# Patient Record
Sex: Female | Born: 1955 | Race: White | Hispanic: No | Marital: Married | Smoking: Current every day smoker
Health system: Southern US, Community
[De-identification: ages and names within clinical notes are randomized; demographics above are authoritative.]

## PROBLEM LIST (undated history)

## (undated) DIAGNOSIS — I10 Essential (primary) hypertension: Secondary | ICD-10-CM

## (undated) DIAGNOSIS — F419 Anxiety disorder, unspecified: Secondary | ICD-10-CM

## (undated) DIAGNOSIS — E119 Type 2 diabetes mellitus without complications: Secondary | ICD-10-CM

## (undated) DIAGNOSIS — F1010 Alcohol abuse, uncomplicated: Secondary | ICD-10-CM

## (undated) HISTORY — PX: CHOLECYSTECTOMY: SHX55

---

## 2006-02-15 ENCOUNTER — Emergency Department (HOSPITAL_COMMUNITY): Admission: EM | Admit: 2006-02-15 | Discharge: 2006-02-15 | Payer: Self-pay | Admitting: Emergency Medicine

## 2006-02-15 ENCOUNTER — Inpatient Hospital Stay (HOSPITAL_COMMUNITY): Admission: EM | Admit: 2006-02-15 | Discharge: 2006-02-19 | Payer: Self-pay | Admitting: *Deleted

## 2006-02-16 ENCOUNTER — Ambulatory Visit: Payer: Self-pay | Admitting: *Deleted

## 2006-12-23 ENCOUNTER — Emergency Department (HOSPITAL_COMMUNITY): Admission: EM | Admit: 2006-12-23 | Discharge: 2006-12-23 | Payer: Self-pay | Admitting: Emergency Medicine

## 2009-05-19 ENCOUNTER — Emergency Department (HOSPITAL_COMMUNITY): Admission: EM | Admit: 2009-05-19 | Discharge: 2009-05-19 | Payer: Self-pay | Admitting: Emergency Medicine

## 2010-06-02 ENCOUNTER — Emergency Department (HOSPITAL_COMMUNITY): Admission: EM | Admit: 2010-06-02 | Discharge: 2010-06-02 | Payer: Self-pay | Admitting: Emergency Medicine

## 2010-09-21 ENCOUNTER — Emergency Department (HOSPITAL_COMMUNITY): Admission: EM | Admit: 2010-09-21 | Discharge: 2010-09-21 | Payer: Self-pay | Admitting: Emergency Medicine

## 2011-03-21 NOTE — Discharge Summary (Signed)
NAME:  Robin Foster, Robin Foster NO.:  [id number]   MEDICAL RECORD NO.:  [id number]          PATIENT TYPE:  IPS   LOCATION:  0506                          FACILITY:  [facility]   PHYSICIAN:  Jasmine Pang, M.D. DATE OF BIRTH:  Feb 25, 1956   DATE OF ADMISSION:  02/15/2006  DATE OF DISCHARGE:  02/19/2006                                 DISCHARGE SUMMARY   IDENTIFICATION:  A 55 year old Caucasian married female who was admitted on  a voluntary basis.   HISTORY OF PRESENT ILLNESS:  The patient requested admission for help with  her depression.  She states she had had suicidal ideation for the 2 weeks  prior to the admission with thoughts of killing herself with carbon monoxide  as her brother did.  She is depressed due to cocaine use and bingeing for  the past 2 weeks with alcohol.  She reports severe cocaine cravings and her  drug use is causing marital friction.  Another stressor revolves around the  death of her mother in 11/23/2005.  She is followed by pain management by  Dr. Gerilyn Pilgrim who is tapering her down on medications due to her buying  methadone off the street about 2 weeks ago.  She complains of decreased  sleep and racing thoughts.   PAST PSYCHIATRIC HISTORY:  This is her first [facility]  admission.  She reports she has been admitted yearly for depression for  about the past 7 years to psych units.  She has a history of substance  abuse, cocaine and alcohol and methadone.  Dr. Gerilyn Pilgrim has been treating  her depression but he apparently will not treat her any longer after she was  buying methadone off the street.   SUBSTANCE ABUSE HISTORY:  The patient smokes, opiates off the street,  cocaine, alcohol.   PAST MEDICAL HISTORY:  Medical problems:  Chronic back pain, hypertension.   MEDICATIONS:  Lexapro 20 mg daily, Klonopin 1 mg t.i.d., Duragesic patch 50  mcg q.72 hours, Lortab 1-2 p.o. t.i.d.   PHYSICAL EXAMINATION:  This was done in the  emergency department prior to  admission here.  It was reviewed by nurse practitioner Lynann Bologna, NP.   ADMISSION LABORATORIES:  CBC was grossly within normal limits.  Hepatic  function panel was within normal limits.  TSH was within normal limits.  Other labs were done in the emergency department prior to admission.  Her  urine drug screen was positive for opiates and benzodiazepines.  Alcohol  level was less than 5.   HOSPITAL COURSE:  Upon admission, the patient was placed on the low-dose  Librium protocol, trazodone 50 mg p.o. q.h.s. p.r.n. insomnia, Diovan 75 mg  p.o. b.i.d., ibuprofen 800 mg p.o. t.i.d. p.r.n. pain, Librium 25 mg p.o.  loading dose on arrival.  On February 16, 2006 she was begun on the Clonidine  protocol to detox from opiates since this appeared to be what Dr. Gerilyn Pilgrim,  her pain physician, was attempting to do outpatient.  She was placed on  Seroquel 25 mg p.o. q.6h p.r.n. agitation and Symmetrel 100 mg p.o. b.i.d.  and Lexapro 20 mg daily.  On February 16, 2006 due to pain she was placed on  Robaxin 750 mg p.o. t.i.d.  On February 18, 2006 she was put back on her pain  medications as we found a prescription for her from a pharmacy by another  doctor.  She was placed on Lortab 500 mg 1 p.o. b.i.d. p.r.n. pain and  Duragesic patch 50 mcg apply 1 patch q.72 hours.   The patient was initially in a lot of pain because of withdrawing from her  opiates.  She spent time in bed and had a hard time integrating herself into  groups due to this pain.  She felt better after we restarted her pain  medications (this was done after calling the pharmacy to find out she had a  valid pain medication prescription).  She began to feel better and enjoyed  visits from her daughter and her husband who she described as both being  very supportive.  She did state however they were upset about her continuing  drug use.  She participated more in groups as she began to feel better and  was able  to go to meals and do other things that she had not been doing  before.  Upon discharge the patient stated she was excited about going home.  She was going to have a family session with her husband who she described as  supportive.  She was still having some insomnia with middle of the night  awakening even with trazodone.  Her appetite was better.  She states she has  been grieving the death of her mother and tried to cover up with street  drugs.  She wants to stay away from this negative peer group and rejoin  church for more positive surroundings.   Mental status had improved at discharge.  The patient was less depressed and  anxious.  There was more psychomotor activity and eye contact was good.  Speech normal rate and flow.  Mood less depressed and anxious, affect wide  range, no suicidal or homicidal ideation, no auditory or visual  hallucinations, no delusions or paranoia, thoughts logical and goal  directed.  Thought content within normal limits.  Cognitive exam grossly  intact.   DISCHARGE DIAGNOSES:  AXIS I:  Major depression, recurrent, severe, without  psychosis, polysubstance dependence (cocaine, benzodiazepines, opiates).  AXIS II:  No diagnosis.  AXIS III:  Chronic back pain, hypertension.  AXIS IV:  Moderate, marital stress due to her drug use, grieving the death  of her mother.  AXIS V:  Global assessment of functioning on admission was 30, global  assessment of functioning highest past year was 62, global assessment of  functioning upon discharge was 45.   DISCHARGE MEDICATIONS:  1.  Diovan 80 mg daily.  2.  Amantadine 100 mg p.o. b.i.d.  3.  Lexapro 20 mg p.o. daily.  4.  Trazodone 50 mg 1-2 p.o. q.h.s.  5.  Seroquel 25 mg 1 p.o. q.6h p.r.n. anxiety.   ACTIVITY LEVEL:  No restrictions.   DIET:  No restrictions.   POST HOSPITAL CARE PLAN:  The patient will have followup arranged for her with a chemical dependence counselor in [facility] and she will  also  be assigned to a psychiatrist to monitor her medications (up until now Dr.  Gerilyn Pilgrim has done this).      Jasmine Pang, M.D.  Electronically Signed     BHS/MEDQ  D:  02/19/2006  T:  02/19/2006  Job:  [id number]
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
## 2012-05-23 DIAGNOSIS — E119 Type 2 diabetes mellitus without complications: Secondary | ICD-10-CM | POA: Insufficient documentation

## 2012-05-23 DIAGNOSIS — I10 Essential (primary) hypertension: Secondary | ICD-10-CM | POA: Insufficient documentation

## 2012-05-23 DIAGNOSIS — K089 Disorder of teeth and supporting structures, unspecified: Secondary | ICD-10-CM | POA: Insufficient documentation

## 2012-05-23 DIAGNOSIS — F17200 Nicotine dependence, unspecified, uncomplicated: Secondary | ICD-10-CM | POA: Insufficient documentation

## 2012-05-24 ENCOUNTER — Emergency Department (HOSPITAL_COMMUNITY)
Admission: EM | Admit: 2012-05-24 | Discharge: 2012-05-24 | Disposition: A | Discharge status: Home / Self Care | Payer: Self-pay | Attending: Emergency Medicine | Admitting: Emergency Medicine

## 2012-05-24 MED FILL — Penicillin V Potassium Tab 250 MG: ORAL | Qty: 2 | Status: AC

## 2012-05-24 MED FILL — Oxycodone w/ Acetaminophen Tab 5-325 MG: ORAL | Qty: 2 | Status: AC

## 2012-05-24 NOTE — ED Notes (Signed)
Refer to downtime documentation.

## 2014-02-05 ENCOUNTER — Encounter (HOSPITAL_COMMUNITY): Payer: Self-pay | Admitting: Emergency Medicine

## 2014-02-05 ENCOUNTER — Emergency Department (HOSPITAL_COMMUNITY)
Admission: EM | Admit: 2014-02-05 | Discharge: 2014-02-05 | Disposition: A | Discharge status: Home / Self Care | Payer: No Typology Code available for payment source | Attending: Emergency Medicine | Admitting: Emergency Medicine

## 2014-02-05 DIAGNOSIS — F17200 Nicotine dependence, unspecified, uncomplicated: Secondary | ICD-10-CM | POA: Insufficient documentation

## 2014-02-05 DIAGNOSIS — R110 Nausea: Secondary | ICD-10-CM | POA: Insufficient documentation

## 2014-02-05 DIAGNOSIS — E119 Type 2 diabetes mellitus without complications: Secondary | ICD-10-CM | POA: Insufficient documentation

## 2014-02-05 DIAGNOSIS — F419 Anxiety disorder, unspecified: Secondary | ICD-10-CM

## 2014-02-05 DIAGNOSIS — Z79899 Other long term (current) drug therapy: Secondary | ICD-10-CM | POA: Insufficient documentation

## 2014-02-05 DIAGNOSIS — F411 Generalized anxiety disorder: Secondary | ICD-10-CM | POA: Insufficient documentation

## 2014-02-05 HISTORY — DX: Anxiety disorder, unspecified: F41.9

## 2014-02-05 HISTORY — DX: Type 2 diabetes mellitus without complications: E11.9

## 2014-02-05 LAB — I-STAT CHEM 8, ED
BUN: 17 mg/dL (ref 6–23)
CALCIUM ION: 1.15 mmol/L (ref 1.13–1.30)
CREATININE: 0.8 mg/dL (ref 0.50–1.10)
Chloride: 100 mEq/L (ref 96–112)
Glucose, Bld: 104 mg/dL — ABNORMAL HIGH (ref 70–99)
HCT: 47 % — ABNORMAL HIGH (ref 36.0–46.0)
HEMOGLOBIN: 16 g/dL — AB (ref 12.0–15.0)
Potassium: 4.4 mEq/L (ref 3.7–5.3)
Sodium: 143 mEq/L (ref 137–147)
TCO2: 31 mmol/L (ref 0–100)

## 2014-02-05 MED ORDER — ONDANSETRON 8 MG PO TBDP
8.0000 mg | ORAL_TABLET | Freq: Once | ORAL | Status: AC
  Administered 2014-02-05: 8 mg via ORAL
  Filled 2014-02-05: qty 1

## 2014-02-05 MED ORDER — ALPRAZOLAM 1 MG PO TABS: 1.0000 mg | ORAL_TABLET | Freq: Three times a day (TID) | ORAL | Status: DC | PRN

## 2014-02-05 NOTE — ED Notes (Signed)
Hx of anxiety w/escalation of more severe anxiety since mid-week.  Has been taking herbal tea, anxiety preparation and nausea herbal prep.  Nausea worse after taking these. Reports palpitations.

## 2014-02-05 NOTE — Discharge Instructions (Signed)
Panic Attacks °Panic attacks are sudden, short feelings of great fear or discomfort. You may have them for no reason when you are relaxed, when you are uneasy (anxious), or when you are sleeping.  °HOME CARE °· Take all your medicines as told. °· Check with your doctor before starting new medicines. °· Keep all doctor visits. °GET HELP IF: °· You are not able to take your medicines as told. °· Your symptoms do not get better. °· Your symptoms get worse. °GET HELP RIGHT AWAY IF: °· Your attacks seem different than your normal attacks. °· You have thoughts about hurting yourself or others. °· You take panic attack medicine and you have a side effect. °MAKE SURE YOU: °· Understand these instructions. °· Will watch your condition. °· Will get help right away if you are not doing well or get worse. °Document Released: 11/22/2010 Document Revised: 08/10/2013 Document Reviewed: 06/03/2013 °[employer]® Patient Information ©2014 [employer]. ° °

## 2014-02-05 NOTE — ED Provider Notes (Signed)
CSN: [id number]     Arrival date & time 02/05/14  2033 History   First MD Initiated Contact with Patient 02/05/14 2111     Chief Complaint  Patient presents with  . Panic Attack  . Nausea     (Consider location/radiation/quality/duration/timing/severity/associated sxs/prior Treatment) HPI Comments: CAMYA HAYDON is a 58 y.o. female who presents to the Emergency Department complaining of increased anxiety and "feeling nervous".  Patient states she has been out of her Xanax for one week.  Patient reports hx of anxiety and has been taking 1 mg Xanax TID for ~30 years.  States her doctor is relocating and she does not have a PMD currently.  Patient states she has been taking a herbal supplement to try to help control the anxiety, but states the symptoms are now worse and she is now nauseous .  She denies chest pain, shortness of breath, dizziness, palpitations, SI or HI.    The history is provided by the patient.    Past Medical History  Diagnosis Date  . Anxiety   . Diabetes mellitus without complication    Past Surgical History  Procedure Laterality Date  . Cholecystectomy     History reviewed. No pertinent family history. History  Substance Use Topics  . Smoking status: Current Every Day Smoker -- 0.50 packs/day    Types: Cigarettes  . Smokeless tobacco: Never Used  . Alcohol Use: No   OB History   Grav Para Term Preterm Abortions TAB SAB Ect Mult Living                 Review of Systems  Constitutional: Negative for fever, activity change and appetite change.  HENT: Negative for trouble swallowing.   Eyes: Negative for visual disturbance.  Respiratory: Negative for cough, chest tightness and shortness of breath.   Cardiovascular: Negative for chest pain, palpitations and leg swelling.  Gastrointestinal: Positive for nausea. Negative for vomiting and abdominal pain.  Genitourinary: Negative for dysuria.  Neurological: Negative for dizziness, syncope, facial asymmetry,  speech difficulty, weakness, light-headedness, numbness and headaches.  Psychiatric/Behavioral: Negative for suicidal ideas, hallucinations, behavioral problems, confusion and self-injury. The patient is nervous/anxious.   All other systems reviewed and are negative.      Allergies  Review of patient's allergies indicates no known allergies.  Home Medications   Current Outpatient Rx  Name  Route  Sig  Dispense  Refill  . gabapentin (NEURONTIN) 600 MG tablet   Oral   Take 1,200 mg by mouth at bedtime.         . metFORMIN (GLUCOPHAGE) 1000 MG tablet   Oral   Take 1,000 mg by mouth 2 (two) times daily with a meal.         . OVER THE COUNTER MEDICATION   Oral   Take 1 capsule by mouth daily as needed (for nerves/anxiety ([employer] supplement)).          BP 177/92  Pulse 84  Temp(Src) 97.9 F (36.6 C) (Oral)  Resp 20  Ht 5\' 6"  (1.676 m)  Wt 204 lb 11.2 oz (92.851 kg)  BMI 33.06 kg/m2  SpO2 96% Physical Exam  Nursing note and vitals reviewed. Constitutional: She is oriented to person, place, and time. She appears well-developed and well-nourished. No distress.  Patient is well appearing and calm.    HENT:  Head: Normocephalic and atraumatic.  Mouth/Throat: Oropharynx is clear and moist.  Eyes: Conjunctivae and EOM are normal. Pupils are equal, round, and reactive to light.  Neck: Normal range of motion. Neck supple. No JVD present. No thyromegaly present.  Cardiovascular: Normal rate, regular rhythm, normal heart sounds and intact distal pulses.   No murmur heard. Pulmonary/Chest: Effort normal and breath sounds normal. No respiratory distress.  Musculoskeletal: Normal range of motion.  Lymphadenopathy:    She has no cervical adenopathy.  Neurological: She is alert and oriented to person, place, and time. She displays normal reflexes. She exhibits normal muscle tone. Coordination normal.  Skin: Skin is warm and dry. No rash noted.  Psychiatric: She has a normal mood  and affect. Her behavior is normal. Thought content normal.    ED Course  Procedures (including critical care time) Labs Review Labs Reviewed  I-STAT CHEM 8, ED - Abnormal; Notable for the following:    Glucose, Bld 104 (*)    Hemoglobin 16.0 (*)    HCT 47.0 (*)    All other components within normal limits   Imaging Review No results found.   EKG Interpretation None      MDM   Final diagnoses:  Anxiety    Nursing notes reported hx of palpitations, but patient denies any palpitations, chest pain, dyspnea, abdominal pain or vomiting, SI or HI.  Pt has hx of diabetes, will check chemistries.    Blood chemistries wnml, no DKA.    Patient is well appearing, calm.  No acute distress. Agrees to f/u , referral info given for [facility].  Pt appears stable for d/c    Peighton Mehra L. Trisha Mangleriplett, PA-C 02/07/14 1941

## 2014-02-08 NOTE — ED Provider Notes (Signed)
Medical screening examination/treatment/procedure(s) were performed by non-physician practitioner and as supervising physician I was immediately available for consultation/collaboration.   EKG Interpretation None        Trent Gabler W Roda Lauture, MD 02/08/14 1802 

## 2015-05-07 ENCOUNTER — Emergency Department (HOSPITAL_COMMUNITY): Payer: PRIVATE HEALTH INSURANCE

## 2015-05-07 ENCOUNTER — Encounter (HOSPITAL_COMMUNITY): Payer: Self-pay

## 2015-05-07 ENCOUNTER — Inpatient Hospital Stay (HOSPITAL_COMMUNITY)
Admission: EM | Admit: 2015-05-07 | Discharge: 2015-05-10 | DRG: 918 | Payer: PRIVATE HEALTH INSURANCE | Attending: Internal Medicine | Admitting: Internal Medicine

## 2015-05-07 DIAGNOSIS — F322 Major depressive disorder, single episode, severe without psychotic features: Secondary | ICD-10-CM | POA: Diagnosis not present

## 2015-05-07 DIAGNOSIS — E876 Hypokalemia: Secondary | ICD-10-CM | POA: Diagnosis present

## 2015-05-07 DIAGNOSIS — E119 Type 2 diabetes mellitus without complications: Secondary | ICD-10-CM | POA: Diagnosis present

## 2015-05-07 DIAGNOSIS — W19XXXA Unspecified fall, initial encounter: Secondary | ICD-10-CM

## 2015-05-07 DIAGNOSIS — F1010 Alcohol abuse, uncomplicated: Secondary | ICD-10-CM | POA: Diagnosis present

## 2015-05-07 DIAGNOSIS — I10 Essential (primary) hypertension: Secondary | ICD-10-CM | POA: Diagnosis present

## 2015-05-07 DIAGNOSIS — T424X2A Poisoning by benzodiazepines, intentional self-harm, initial encounter: Principal | ICD-10-CM | POA: Diagnosis present

## 2015-05-07 DIAGNOSIS — F419 Anxiety disorder, unspecified: Secondary | ICD-10-CM | POA: Diagnosis present

## 2015-05-07 DIAGNOSIS — T50902A Poisoning by unspecified drugs, medicaments and biological substances, intentional self-harm, initial encounter: Secondary | ICD-10-CM

## 2015-05-07 DIAGNOSIS — R45851 Suicidal ideations: Secondary | ICD-10-CM | POA: Diagnosis present

## 2015-05-07 DIAGNOSIS — T50901A Poisoning by unspecified drugs, medicaments and biological substances, accidental (unintentional), initial encounter: Secondary | ICD-10-CM | POA: Diagnosis present

## 2015-05-07 DIAGNOSIS — R400 Somnolence: Secondary | ICD-10-CM | POA: Diagnosis present

## 2015-05-07 DIAGNOSIS — T1491XA Suicide attempt, initial encounter: Secondary | ICD-10-CM

## 2015-05-07 DIAGNOSIS — T424X2D Poisoning by benzodiazepines, intentional self-harm, subsequent encounter: Secondary | ICD-10-CM | POA: Diagnosis not present

## 2015-05-07 DIAGNOSIS — F321 Major depressive disorder, single episode, moderate: Secondary | ICD-10-CM | POA: Diagnosis not present

## 2015-05-07 DIAGNOSIS — F1410 Cocaine abuse, uncomplicated: Secondary | ICD-10-CM | POA: Diagnosis present

## 2015-05-07 DIAGNOSIS — F329 Major depressive disorder, single episode, unspecified: Secondary | ICD-10-CM | POA: Diagnosis present

## 2015-05-07 DIAGNOSIS — T50902D Poisoning by unspecified drugs, medicaments and biological substances, intentional self-harm, subsequent encounter: Secondary | ICD-10-CM | POA: Diagnosis not present

## 2015-05-07 DIAGNOSIS — T424X4A Poisoning by benzodiazepines, undetermined, initial encounter: Secondary | ICD-10-CM | POA: Diagnosis not present

## 2015-05-07 DIAGNOSIS — E118 Type 2 diabetes mellitus with unspecified complications: Secondary | ICD-10-CM | POA: Diagnosis not present

## 2015-05-07 DIAGNOSIS — T424X1A Poisoning by benzodiazepines, accidental (unintentional), initial encounter: Secondary | ICD-10-CM | POA: Diagnosis present

## 2015-05-07 HISTORY — DX: Alcohol abuse, uncomplicated: F10.10

## 2015-05-07 LAB — I-STAT TROPONIN, ED: Troponin i, poc: 0 ng/mL (ref 0.00–0.08)

## 2015-05-07 LAB — CBC WITH DIFFERENTIAL/PLATELET
BASOS ABS: 0 10*3/uL (ref 0.0–0.1)
BASOS PCT: 0 % (ref 0–1)
Eosinophils Absolute: 0.1 10*3/uL (ref 0.0–0.7)
Eosinophils Relative: 1 % (ref 0–5)
HEMATOCRIT: 43.9 % (ref 36.0–46.0)
Hemoglobin: 13.9 g/dL (ref 12.0–15.0)
LYMPHS PCT: 25 % (ref 12–46)
Lymphs Abs: 2.8 10*3/uL (ref 0.7–4.0)
MCH: 28.4 pg (ref 26.0–34.0)
MCHC: 31.7 g/dL (ref 30.0–36.0)
MCV: 89.6 fL (ref 78.0–100.0)
Monocytes Absolute: 0.6 10*3/uL (ref 0.1–1.0)
Monocytes Relative: 5 % (ref 3–12)
NEUTROS PCT: 68 % (ref 43–77)
Neutro Abs: 7.6 10*3/uL (ref 1.7–7.7)
PLATELETS: 237 10*3/uL (ref 150–400)
RBC: 4.9 MIL/uL (ref 3.87–5.11)
RDW: 13.7 % (ref 11.5–15.5)
WBC: 11.1 10*3/uL — AB (ref 4.0–10.5)

## 2015-05-07 LAB — RAPID URINE DRUG SCREEN, HOSP PERFORMED
Amphetamines: NOT DETECTED
Barbiturates: NOT DETECTED
Benzodiazepines: POSITIVE — AB
COCAINE: POSITIVE — AB
Opiates: NOT DETECTED
Tetrahydrocannabinol: NOT DETECTED

## 2015-05-07 LAB — COMPREHENSIVE METABOLIC PANEL
ALT: 12 U/L — AB (ref 14–54)
ANION GAP: 9 (ref 5–15)
AST: 17 U/L (ref 15–41)
Albumin: 3.9 g/dL (ref 3.5–5.0)
Alkaline Phosphatase: 120 U/L (ref 38–126)
BUN: 13 mg/dL (ref 6–20)
CHLORIDE: 102 mmol/L (ref 101–111)
CO2: 29 mmol/L (ref 22–32)
CREATININE: 0.78 mg/dL (ref 0.44–1.00)
Calcium: 9.3 mg/dL (ref 8.9–10.3)
GFR calc Af Amer: >60 mL/min (ref >60)
GFR calc non Af Amer: >60 mL/min (ref >60)
GLUCOSE: 123 mg/dL — AB (ref 65–99)
POTASSIUM: 3.6 mmol/L (ref 3.5–5.1)
SODIUM: 140 mmol/L (ref 135–145)
Total Bilirubin: 0.7 mg/dL (ref 0.3–1.2)
Total Protein: 7.3 g/dL (ref 6.5–8.1)

## 2015-05-07 LAB — BLOOD GAS, VENOUS
ACID-BASE EXCESS: 2.8 mmol/L — AB (ref 0.0–2.0)
Bicarbonate: 28.5 mEq/L — ABNORMAL HIGH (ref 20.0–24.0)
FIO2: 0.21 %
O2 Saturation: 68.3 %
Patient temperature: 98.6
TCO2: 25.2 mmol/L (ref 0–100)
pCO2, Ven: 50 mmHg (ref 45.0–50.0)
pH, Ven: 7.374 — ABNORMAL HIGH (ref 7.250–7.300)
pO2, Ven: 36.9 mmHg (ref 30.0–45.0)

## 2015-05-07 LAB — ETHANOL: Alcohol, Ethyl (B): <5 mg/dL (ref <5)

## 2015-05-07 LAB — URINALYSIS, ROUTINE W REFLEX MICROSCOPIC
BILIRUBIN URINE: NEGATIVE
Glucose, UA: NEGATIVE mg/dL
Hgb urine dipstick: NEGATIVE
KETONES UR: NEGATIVE mg/dL
Leukocytes, UA: NEGATIVE
Nitrite: NEGATIVE
PH: 6.5 (ref 5.0–8.0)
Protein, ur: NEGATIVE mg/dL
Specific Gravity, Urine: 1.01 (ref 1.005–1.030)
Urobilinogen, UA: 0.2 mg/dL (ref 0.0–1.0)

## 2015-05-07 LAB — I-STAT CG4 LACTIC ACID, ED: LACTIC ACID, VENOUS: 1.21 mmol/L (ref 0.5–2.0)

## 2015-05-07 LAB — SALICYLATE LEVEL: Salicylate Lvl: <4 mg/dL (ref 2.8–30.0)

## 2015-05-07 LAB — ACETAMINOPHEN LEVEL

## 2015-05-07 MED ORDER — SODIUM CHLORIDE 0.9 % IV SOLN
INTRAVENOUS | Status: DC
  Administered 2015-05-07 – 2015-05-10 (×4): via INTRAVENOUS

## 2015-05-07 MED ORDER — LORAZEPAM 2 MG/ML IJ SOLN
1.0000 mg | Freq: Four times a day (QID) | INTRAMUSCULAR | Status: DC | PRN
  Administered 2015-05-08 – 2015-05-10 (×4): 1 mg via INTRAVENOUS
  Filled 2015-05-07 (×6): qty 1

## 2015-05-07 MED ORDER — ENOXAPARIN SODIUM 40 MG/0.4ML ~~LOC~~ SOLN
40.0000 mg | Freq: Every day | SUBCUTANEOUS | Status: DC
  Administered 2015-05-07 – 2015-05-09 (×3): 40 mg via SUBCUTANEOUS
  Filled 2015-05-07 (×3): qty 0.4

## 2015-05-07 MED ORDER — HYDROCHLOROTHIAZIDE 25 MG PO TABS: 25.0000 mg | ORAL_TABLET | Freq: Every day | ORAL | Status: DC

## 2015-05-07 MED ORDER — LISINOPRIL 40 MG PO TABS: 40.0000 mg | ORAL_TABLET | Freq: Every day | ORAL | Status: DC

## 2015-05-07 MED ORDER — SODIUM CHLORIDE 0.9 % IV BOLUS (SEPSIS)
1000.0000 mL | Freq: Once | INTRAVENOUS | Status: AC
  Administered 2015-05-07: 1000 mL via INTRAVENOUS

## 2015-05-07 NOTE — ED Notes (Signed)
Patient's daughter reports that the patient had texted a friend and told them that she had taken the Xanax.Pataient's [facility] stating that she had taken an overdose of Xanax last night and that she had attempted suicide. When patient's daughter arrived, the patient wanted to come to [facility]. Patient's daughter transported the patient to the ED. Patient had a new bottle of Xanax filled (90 tabs) 3 days ago and the current bottle is empty. Patient also had empty beer cans in her trash when the daughter arrived. Patient's daughter states that the patient probably drank last night because she ran out of money to buy more. Patient is very lethargic and never tells the same story twice when questioned. Patient's daughter also reports that the patient takes methadone that is not prescribed.
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
## 2015-05-07 NOTE — ED Provider Notes (Signed)
CSN: [id number]     Arrival date & time 05/07/15  1857 History   First MD Initiated Contact with Patient 05/07/15 1913     Chief Complaint  Patient presents with  . Suicidal     The history is provided by the patient and a relative. No language interpreter was used.   Ms. Robin Foster presents for overdose/SI.  Level V caveat due to intoxication.  Hx is provided by pt and daughter.  Per her daughter she has a long history of drug abuse. The patient states that last night she took about 30, 0.5mg  tablets of Xanax. She states this was an over dose attempt. She is actively suicidal now. She states that her daughter around 2:30 this afternoon sitting which she did. She is told the daughter multiple different times that she took the drugs including last night and this morning. The patient states that she also took her daily medications including Neurontin, lisinopril. The daughter states that [facility] for a fall and was diagnosed with rib fractures. The patient had a prescription of 90 Xanax filled on Friday.  Past Medical History  Diagnosis Date  . Anxiety   . Diabetes mellitus without complication   . ETOH abuse    Past Surgical History  Procedure Laterality Date  . Cholecystectomy     No family history on file. History  Substance Use Topics  . Smoking status: Current Every Day Smoker -- 0.50 packs/day    Types: Cigarettes  . Smokeless tobacco: Never Used  . Alcohol Use: Yes     Comment: daily use   OB History    No data available     Review of Systems  All other systems reviewed and are negative.     Allergies  Review of patient's allergies indicates no known allergies.  Home Medications   Prior to Admission medications   Medication Sig Start Date End Date Taking? Authorizing Provider  ALPRAZolam Prudy Feeler(XANAX) 0.5 MG tablet Take 1 tablet by mouth 3 (three) times daily. 05/04/15  Yes Historical Provider, MD  DULoxetine (CYMBALTA)  60 MG capsule Take 60 mg by mouth daily.   Yes Historical Provider, MD  gabapentin (NEURONTIN) 800 MG tablet Take 800 mg by mouth 3 (three) times daily.   Yes Historical Provider, MD  hydrochlorothiazide (HYDRODIURIL) 25 MG tablet Take 25 mg by mouth daily.   Yes Historical Provider, MD  HYDROcodone-acetaminophen (NORCO/VICODIN) 5-325 MG per tablet Take 1 tablet by mouth every 6 (six) hours as needed. pain 04/29/15  Yes Historical Provider, MD  lisinopril (PRINIVIL,ZESTRIL) 40 MG tablet Take 40 mg by mouth daily.   Yes Historical Provider, MD  ALPRAZolam Prudy Feeler(XANAX) 1 MG tablet Take 1 tablet (1 mg total) by mouth 3 (three) times daily as needed for anxiety. Patient not taking: Reported on 05/07/2015 02/05/14   Tammy Triplett, PA-C  metFORMIN (GLUCOPHAGE) 1000 MG tablet Take 1,000 mg by mouth 2 (two) times daily with a meal.    Historical Provider, MD   BP 101/70 mmHg  Pulse 90  Temp(Src) 98.8 F (37.1 C) (Oral)  Resp 16  SpO2 91% Physical Exam  Constitutional: She is oriented to person, place, and time. She appears well-developed and well-nourished.  HENT:  Head: Normocephalic and atraumatic.   Dry mucous membranes  Eyes: Pupils are equal, round, and reactive to light.  Cardiovascular: Normal rate and regular rhythm.   No murmur heard. Pulmonary/Chest: Effort normal and breath sounds normal. No respiratory  distress.  Abdominal: Soft. There is no tenderness. There is no rebound and no guarding.  Musculoskeletal: She exhibits no edema or tenderness.  Neurological: She is oriented to person, place, and time.   Lethargic, moves all extremities. She arouses to verbal stimuli and is able to partially give a history before falling back asleep  Skin: Skin is warm and dry.  Psychiatric:  endorses suicidal ideations.  Nursing note and vitals reviewed.   ED Course  Procedures (including critical care time) Labs Review Labs Reviewed  COMPREHENSIVE METABOLIC PANEL - Abnormal; Notable for the  following:    Glucose, Bld 123 (*)    ALT 12 (*)    All other components within normal limits  CBC WITH DIFFERENTIAL/PLATELET - Abnormal; Notable for the following:    WBC 11.1 (*)    All other components within normal limits  URINE RAPID DRUG SCREEN, HOSP PERFORMED - Abnormal; Notable for the following:    Cocaine POSITIVE (*)    Benzodiazepines POSITIVE (*)    All other components within normal limits  ACETAMINOPHEN LEVEL - Abnormal; Notable for the following:    Acetaminophen (Tylenol), Serum <10 (*)    All other components within normal limits  BLOOD GAS, VENOUS - Abnormal; Notable for the following:    pH, Ven 7.374 (*)    Bicarbonate 28.5 (*)    Acid-Base Excess 2.8 (*)    All other components within normal limits  MRSA PCR SCREENING  ETHANOL  URINALYSIS, ROUTINE W REFLEX MICROSCOPIC (NOT AT [facility])  SALICYLATE LEVEL  COMPREHENSIVE METABOLIC PANEL  HEMOGLOBIN A1C  I-STAT CG4 LACTIC ACID, ED  Rosezena Sensor, ED    Imaging Review Dg Chest Port 1 View  05/07/2015   CLINICAL DATA:  Short of breath today.  Overdose last night.  EXAM: PORTABLE CHEST - 1 VIEW  COMPARISON:  None.  FINDINGS: Artifact overlies chest. The patient has taken a poor inspiration. Heart size is normal allowing for the technical factors. There is mild volume loss at both lung bases. Upper lungs are clear.  IMPRESSION: Mild volume loss at the lung bases.   Electronically Signed   By: Paulina Fusi M.D.   On: 05/07/2015 20:31     EKG Interpretation   Date/Time:  Monday May 07 2015 19:20:23 EDT Ventricular Rate:  78 PR Interval:  138 QRS Duration: 84 QT Interval:  401 QTC Calculation: 457 R Axis:   31 Text Interpretation:  Sinus rhythm Baseline wander in lead(s) I II aVR V1  V2 V3 V4 V5 V6 Confirmed by Lincoln Brigham [id number]) on 05/07/2015 7:53:02 PM      MDM   Final diagnoses:  Overdose, intentional self-harm, initial encounter  Suicide attempt     Patient here for evaluation of suicide attempt by  overdose. Patient is actively suicidal in the emergency department. Patient was initially lethargic, she fall then attempted to get up and leave the department. IVC papers were filed. Patient then recurrently somnolent but protecting her airway. Plan to  Admit to medicine for management of her overdose.   Tilden Fossa, MD 05/07/15 [phone]

## 2015-05-07 NOTE — H&P (Signed)
History and Physical  Robin Foster Robin Foster:[id number] DOB: [birth year] DOA: 05/07/2015  PCP: Default, Provider, MD   Chief Complaint: Xanax overdose, Suicidal attempt.   HPI: Robin Foster is a 59 y.o. female with history of severe depression with recurrent suicidal ideation and attempts ( check previous ER notes) who was brought by her daughter for overdose on Xanax. Family is not available at bedside and history was taken from ED physician Dr.Rees. Patient had a new bottle of Xanax filled 3 days ago ( 90 tabs) but currently it's empty. Daughter believes that her mom took 30 tablet of Xanax all at once last night as she told her so. Today  There was also empty beer cans in her trash found by daughter. Daughter also reports that patient takes methadone without prescription. Patient is unable to provide history. She is sleepy and lethargic but responds to chest rubs by opening her eyes.    Review of Systems: Unable to take due to clinical condition.    Past Medical History  Diagnosis Date  . Anxiety   . Diabetes mellitus without complication   . ETOH abuse    Past Surgical History  Procedure Laterality Date  . Cholecystectomy     Social History:  reports that she has been smoking Cigarettes.  She has been smoking about 0.50 packs per day. She has never used smokeless tobacco. She reports that she drinks alcohol. She reports that she uses illicit drugs.  Allergy: No Known Allergies  Family History:  Unable to take due to clinical condition.   Prior to Admission medications   Medication Sig Start Date End Date Taking? Authorizing Provider  ALPRAZolam Prudy Feeler) 0.5 MG tablet Take 1 tablet by mouth 3 (three) times daily. 05/04/15  Yes Historical Provider, MD  DULoxetine (CYMBALTA) 60 MG capsule Take 60 mg by mouth daily.   Yes Historical Provider, MD  gabapentin (NEURONTIN) 800 MG tablet Take 800 mg by mouth 3 (three) times daily.   Yes Historical Provider, MD  hydrochlorothiazide  (HYDRODIURIL) 25 MG tablet Take 25 mg by mouth daily.   Yes Historical Provider, MD  HYDROcodone-acetaminophen (NORCO/VICODIN) 5-325 MG per tablet Take 1 tablet by mouth every 6 (six) hours as needed. pain 04/29/15  Yes Historical Provider, MD  lisinopril (PRINIVIL,ZESTRIL) 40 MG tablet Take 40 mg by mouth daily.   Yes Historical Provider, MD  ALPRAZolam Prudy Feeler) 1 MG tablet Take 1 tablet (1 mg total) by mouth 3 (three) times daily as needed for anxiety. Patient not taking: Reported on 05/07/2015 02/05/14   Tammy Triplett, PA-C  metFORMIN (GLUCOPHAGE) 1000 MG tablet Take 1,000 mg by mouth 2 (two) times daily with a meal.    Historical Provider, MD    Physical Exam: BP 141/82 mmHg  Pulse 74  Temp(Src) 98.8 F (37.1 C) (Oral)  Resp 21  SpO2 91%  General:  Sleepy, lethargic, responds to chest rubs and calling her name loud.  Eyes: round pupils, slightly dilated by reactive to light.  ENT: Dry mucous membranes. Neck: supple Cardiovascular: RRR Respiratory: No crackle. No wheezing.  Abdomen: soft, BS+ Skin: Abrasion on her left wrist.  Musculoskeletal: No joint swelling.  Psychiatric & Neurologic: Suicidal, depressed, lethargic.Otherwise unable to assess.           Labs on Admission:  Basic Metabolic Panel:  Recent Labs Lab 05/07/15 1948  NA 140  K 3.6  CL 102  CO2 29  GLUCOSE 123*  BUN 13  CREATININE 0.78  CALCIUM 9.3   Liver  Function Tests:  Recent Labs Lab 05/07/15 1948  AST 17  ALT 12*  ALKPHOS 120  BILITOT 0.7  PROT 7.3  ALBUMIN 3.9   No results for input(s): LIPASE, AMYLASE in the last 168 hours. No results for input(s): AMMONIA in the last 168 hours. CBC:  Recent Labs Lab 05/07/15 1948  WBC 11.1*  NEUTROABS 7.6  HGB 13.9  HCT 43.9  MCV 89.6  PLT 237   Cardiac Enzymes: No results for input(s): CKTOTAL, CKMB, CKMBINDEX, TROPONINI in the last 168 hours.  BNP (last 3 results) No results for input(s): BNP in the last 8760 hours.  ProBNP (last 3  results) No results for input(s): PROBNP in the last 8760 hours.  CBG: No results for input(s): GLUCAP in the last 168 hours.  Radiological Exams on Admission: Dg Chest Port 1 View  05/07/2015   CLINICAL DATA:  Short of breath today.  Overdose last night.  EXAM: PORTABLE CHEST - 1 VIEW  COMPARISON:  None.  FINDINGS: Artifact overlies chest. The patient has taken a poor inspiration. Heart size is normal allowing for the technical factors. There is mild volume loss at both lung bases. Upper lungs are clear.  IMPRESSION: Mild volume loss at the lung bases.   Electronically Signed   By: Paulina FusiMark  Shogry M.D.   On: 05/07/2015 20:31    EKG: Independently reviewed. Unspecific T/ST changes in II,III. No old EKG to compare.   Assessment/Plan:  Benzodiazepine overdose:  With possible alcohol overdose.  Drug: Xanax ( half life : 6-27 hours). Will admit to step down unit with continuous vital signs monitoring and supportive care.  Will monitor for : CNS depression and respiratory depression.  Will start NS at 100 cc/hr and hold BP meds for now. Will watch for withdrawal symptoms & start Ativan 1 mg every 6 hours as needed for withdrawal symptoms. EKG with non specific changes. CXR clear. CBC/CMP with unremarkable results.  UDS also positive for cocaine: no signs of hemodynamic instability or respiratory disease; will monitor.    Depression with suicidal attempt: Admitted to step down, restraints ordered with a bed sitter.  Will need psychiatry consult in AM.  Can't leave AMA.   HTN:  Will hold BP meds for now Home meds: HCTZ and lisinopril.   DM:  Will hold metformin for now.  Check blood sugar before meals and at bedtime.  Will check HbA1c with morning labs.    Code Status:  Full code   Family Communication: Daughter left before I saw patient.   Disposition Plan: expected stay 2-3 days.Needs Psych evaluation.    Eston EstersAhmad Zachry Hopfensperger MD. [facility]

## 2015-05-07 NOTE — ED Notes (Signed)
Spoke with Patty from Poison Control: -Observe for CNS depression -Respiratory depression -Supportive care -Treat hypotension with IVF's -Observe for 4 hours, [facility]

## 2015-05-07 NOTE — ED Notes (Signed)
Patients daughter took all her clothes (shirt, pants, socks, underwear, shoes, and bra) home with her. Daughter Shanda Bumps(Jessica) says call if any questions or any changes. Call jessica at [phone].

## 2015-05-08 DIAGNOSIS — E876 Hypokalemia: Secondary | ICD-10-CM | POA: Diagnosis present

## 2015-05-08 DIAGNOSIS — T50902A Poisoning by unspecified drugs, medicaments and biological substances, intentional self-harm, initial encounter: Secondary | ICD-10-CM | POA: Diagnosis present

## 2015-05-08 DIAGNOSIS — T424X2D Poisoning by benzodiazepines, intentional self-harm, subsequent encounter: Secondary | ICD-10-CM

## 2015-05-08 DIAGNOSIS — R45851 Suicidal ideations: Secondary | ICD-10-CM

## 2015-05-08 DIAGNOSIS — T424X1A Poisoning by benzodiazepines, accidental (unintentional), initial encounter: Secondary | ICD-10-CM | POA: Diagnosis present

## 2015-05-08 LAB — COMPREHENSIVE METABOLIC PANEL
ALT: 12 U/L — AB (ref 14–54)
AST: 13 U/L — ABNORMAL LOW (ref 15–41)
Albumin: 3.7 g/dL (ref 3.5–5.0)
Alkaline Phosphatase: 115 U/L (ref 38–126)
Anion gap: 9 (ref 5–15)
BUN: 13 mg/dL (ref 6–20)
CHLORIDE: 106 mmol/L (ref 101–111)
CO2: 28 mmol/L (ref 22–32)
Calcium: 9 mg/dL (ref 8.9–10.3)
Creatinine, Ser: 0.68 mg/dL (ref 0.44–1.00)
GFR calc Af Amer: >60 mL/min (ref >60)
GFR calc non Af Amer: >60 mL/min (ref >60)
GLUCOSE: 110 mg/dL — AB (ref 65–99)
Potassium: 3.3 mmol/L — ABNORMAL LOW (ref 3.5–5.1)
Sodium: 143 mmol/L (ref 135–145)
TOTAL PROTEIN: 6.7 g/dL (ref 6.5–8.1)
Total Bilirubin: 0.8 mg/dL (ref 0.3–1.2)

## 2015-05-08 LAB — GLUCOSE, CAPILLARY
GLUCOSE-CAPILLARY: 93 mg/dL (ref 65–99)
GLUCOSE-CAPILLARY: 141 mg/dL — AB (ref 65–99)
GLUCOSE-CAPILLARY: 108 mg/dL — AB (ref 65–99)

## 2015-05-08 LAB — MRSA PCR SCREENING: MRSA BY PCR: NEGATIVE

## 2015-05-08 MED ORDER — POTASSIUM CHLORIDE 10 MEQ/100ML IV SOLN
10.0000 meq | INTRAVENOUS | Status: DC
  Administered 2015-05-08: 10 meq via INTRAVENOUS
  Filled 2015-05-08: qty 100

## 2015-05-08 MED ORDER — THIAMINE HCL 100 MG/ML IJ SOLN
100.0000 mg | Freq: Every day | INTRAMUSCULAR | Status: DC
  Filled 2015-05-08: qty 1

## 2015-05-08 MED ORDER — HYDROCHLOROTHIAZIDE 25 MG PO TABS
25.0000 mg | ORAL_TABLET | Freq: Every day | ORAL | Status: DC
  Administered 2015-05-09 – 2015-05-10 (×2): 25 mg via ORAL
  Filled 2015-05-08 (×2): qty 1

## 2015-05-08 MED ORDER — TRAMADOL HCL 50 MG PO TABS
50.0000 mg | ORAL_TABLET | Freq: Four times a day (QID) | ORAL | Status: DC | PRN
  Administered 2015-05-08 – 2015-05-09 (×2): 50 mg via ORAL
  Filled 2015-05-08 (×3): qty 1

## 2015-05-08 MED ORDER — VITAMIN B-1 100 MG PO TABS
100.0000 mg | ORAL_TABLET | Freq: Every day | ORAL | Status: DC
  Administered 2015-05-08 – 2015-05-10 (×3): 100 mg via ORAL
  Filled 2015-05-08 (×3): qty 1

## 2015-05-08 MED ORDER — TRAMADOL HCL 50 MG PO TABS: 50.0000 mg | ORAL_TABLET | Freq: Four times a day (QID) | ORAL | Status: DC | PRN

## 2015-05-08 MED ORDER — ADULT MULTIVITAMIN W/MINERALS CH
1.0000 | ORAL_TABLET | Freq: Every day | ORAL | Status: DC
  Administered 2015-05-08 – 2015-05-10 (×3): 1 via ORAL
  Filled 2015-05-08 (×3): qty 1

## 2015-05-08 MED ORDER — DIPHENHYDRAMINE HCL 50 MG PO CAPS
50.0000 mg | ORAL_CAPSULE | Freq: Once | ORAL | Status: AC
  Administered 2015-05-08: 50 mg via ORAL
  Filled 2015-05-08: qty 1

## 2015-05-08 MED ORDER — NICOTINE 21 MG/24HR TD PT24
21.0000 mg | MEDICATED_PATCH | Freq: Every day | TRANSDERMAL | Status: DC
  Administered 2015-05-08 – 2015-05-10 (×3): 21 mg via TRANSDERMAL
  Filled 2015-05-08 (×3): qty 1

## 2015-05-08 MED ORDER — LISINOPRIL 40 MG PO TABS
40.0000 mg | ORAL_TABLET | Freq: Every day | ORAL | Status: DC
  Administered 2015-05-09 – 2015-05-10 (×2): 40 mg via ORAL
  Filled 2015-05-08: qty 1
  Filled 2015-05-08: qty 4

## 2015-05-08 MED ORDER — KETOROLAC TROMETHAMINE 30 MG/ML IJ SOLN
INTRAMUSCULAR | Status: AC
  Filled 2015-05-08: qty 1

## 2015-05-08 MED ORDER — KETOROLAC TROMETHAMINE 30 MG/ML IJ SOLN
30.0000 mg | Freq: Once | INTRAMUSCULAR | Status: AC
  Administered 2015-05-08: 30 mg via INTRAVENOUS

## 2015-05-08 MED ORDER — FOLIC ACID 1 MG PO TABS
1.0000 mg | ORAL_TABLET | Freq: Every day | ORAL | Status: DC
  Administered 2015-05-08 – 2015-05-10 (×3): 1 mg via ORAL
  Filled 2015-05-08 (×3): qty 1

## 2015-05-08 MED ORDER — POTASSIUM CHLORIDE CRYS ER 20 MEQ PO TBCR
40.0000 meq | EXTENDED_RELEASE_TABLET | Freq: Once | ORAL | Status: AC
  Administered 2015-05-08: 40 meq via ORAL
  Filled 2015-05-08: qty 2

## 2015-05-08 NOTE — Progress Notes (Signed)
Triple antibiotic applied to abrasions on right arm and covered with dressing.

## 2015-05-08 NOTE — Care Management Note (Signed)
Case Management Note  Patient Details  Name: Sula Rumpleatricia C Anastos MRN: [id number] Date of Birth: 11/06/1955  Subjective/Objective:                  overdose  Action/Plan: tbd   Expected Discharge Date:       [id number]           Expected Discharge Plan:  Home/Self Care  In-House Referral:  Clinical Social Work  Discharge planning Services  CM Consult  Post Acute Care Choice:  NA Choice offered to:  NA  DME Arranged:  N/A DME Agency:  NA  HH Arranged:  NA HH Agency:  NA  Status of Service:  In process, will continue to follow  Medicare Important Message Given:    Date Medicare IM Given:    Medicare IM give by:    Date Additional Medicare IM Given:    Additional Medicare Important Message give by:     If discussed at Long Length of Stay Meetings, dates discussed:    Additional Comments:  Golda AcreDavis, Rhonda Lynn, RN 05/08/2015, 8:48 AM

## 2015-05-08 NOTE — Progress Notes (Signed)
[facility] PROGRESS NOTE  Robin Foster ZOX:[id number] DOB: 04-26-1956 DOA: 05/07/2015 PCP: Default, Provider, MD  Brief narrative 59 year old female with history of severe depression with recurrent  suicidal ideations and attempts admitted with Xanax overdose. Patient recently failed a new bottle of Xanax 3 days ago (90 tablets) which was empty. Patient this morning reports that she took about 30 tablets of Xanax as she wanted to kill himself due to a lot of family stressors. Daughter also informed that patient takes methadone without prescription. Patient was somnolent upon arrival. Remaining vitals were stable. Patient reports drinking alcohol and urine drug she was positive for cocaine . Patient admitted to stepdown unit.   Assessment/Plan: Benzodiazepine overdose Likely associated with alcohol use as well. Continue step down monitoring. Continue Software engineer. Currently awake and oriented. Continue neuro checks. -Continue IV hydration. Monitor for benzodiazepine withdrawal symptoms. Started on when necessary IV Ativan. Psych consulted. Will follow up with recommendations. -Poison control consulted on admission.  Major depression with suicidal attempt - bedside sitter. Cannot leave AMA. Psych consulted.  Hypokalemia Replenished   Hypertension Resume home blood pressure medications  Type 2 diabetes mellitus Hold metformin. Monitor on sliding scale insulin  Cocaine abuse Counseled on cessation.  etoh abuse Pt unclear how much she drank. Will monitor on CIWA.  Diet: Diabetic DVT prophylaxis: Subcutaneous Lovenox    Code Status: Full code Family Communication: None at bedside Disposition Plan:  Possibly transfer to telemetry this afternoon. Psych consult pending. Likely need inpatient psych hospitalization once medically stable.   Consultants:  Psychiatry  Procedures:  None  Antibiotics:  None  HPI/Subjective: Patient seen and examined this morning.  She is awake and alert. Denies any suicidal ideations at this time. Denies any headache, blurred vision, dizziness, nausea, vomiting, chest pain, shortness of breath, palpitations, abdominal pain, bowel or urinary symptoms.  Objective: Filed Vitals:   05/08/15 0800  BP:   Pulse:   Temp: 98.2 F (36.8 C)  Resp:     Intake/Output Summary (Last 24 hours) at 05/08/15 1105 Last data filed at 05/08/15 0914  Gross per 24 hour  Intake 1011.67 ml  Output      0 ml  Net 1011.67 ml   There were no vitals filed for this visit.  Exam:   General:  Elderly female in no acute distress  HEENT: No pallor, no icterus, moist oral mucosa, supple neck  Chest: Clear to auscultation bilaterally, no added sounds  CVS: Normal S1 and S2, no murmurs rub or gallop  GI: Soft, nondistended, nontender, bowel sounds present  Musculoskeletal: Warm, no edema  CNS: Alert and oriented, no tremors, nonfocal exam    Data Reviewed: Basic Metabolic Panel:  Recent Labs Lab 05/07/15 1948 05/08/15 0345  NA 140 143  K 3.6 3.3*  CL 102 106  CO2 29 28  GLUCOSE 123* 110*  BUN 13 13  CREATININE 0.78 0.68  CALCIUM 9.3 9.0   Liver Function Tests:  Recent Labs Lab 05/07/15 1948 05/08/15 0345  AST 17 13*  ALT 12* 12*  ALKPHOS 120 115  BILITOT 0.7 0.8  PROT 7.3 6.7  ALBUMIN 3.9 3.7   No results for input(s): LIPASE, AMYLASE in the last 168 hours. No results for input(s): AMMONIA in the last 168 hours. CBC:  Recent Labs Lab 05/07/15 1948  WBC 11.1*  NEUTROABS 7.6  HGB 13.9  HCT 43.9  MCV 89.6  PLT 237   Cardiac Enzymes: No results for input(s): CKTOTAL, CKMB, CKMBINDEX, TROPONINI in the  last 168 hours. BNP (last 3 results) No results for input(s): BNP in the last 8760 hours.  ProBNP (last 3 results) No results for input(s): PROBNP in the last 8760 hours.  CBG:  Recent Labs Lab 05/08/15 0117 05/08/15 0758  GLUCAP 93 141*    Recent Results (from the past 240 hour(s))  MRSA  PCR Screening     Status: None   Collection Time: 05/07/15 10:00 PM  Result Value Ref Range Status   MRSA by PCR NEGATIVE NEGATIVE Final    Comment:        The GeneXpert MRSA Assay (FDA approved for NASAL specimens only), is one component of a comprehensive MRSA colonization surveillance program. It is not intended to diagnose MRSA infection nor to guide or monitor treatment for MRSA infections.      Studies: Dg Chest Port 1 View  05/07/2015   CLINICAL DATA:  Short of breath today.  Overdose last night.  EXAM: PORTABLE CHEST - 1 VIEW  COMPARISON:  None.  FINDINGS: Artifact overlies chest. The patient has taken a poor inspiration. Heart size is normal allowing for the technical factors. There is mild volume loss at both lung bases. Upper lungs are clear.  IMPRESSION: Mild volume loss at the lung bases.   Electronically Signed   By: Paulina FusiMark  Shogry M.D.   On: 05/07/2015 20:31    Scheduled Meds: . enoxaparin (LOVENOX) injection  40 mg Subcutaneous QHS   Continuous Infusions: . sodium chloride 100 mL/hr at 05/08/15 0615      Time spent: 25 minutes    Robin Foster  [facility] Pager [phone] If 7PM-7AM, please contact night-coverage at [email or web address], password [email or web address] 05/08/2015, 11:05 AM  LOS: 1 day

## 2015-05-08 NOTE — Progress Notes (Signed)
Talked with poison control and they were given an update on pt. Status.

## 2015-05-08 NOTE — Consult Note (Signed)
[facility] Consult   Reason for Consult:  Benzodiazepine Overdose  Referring Physician:  Dr. Clementeen Graham  Patient Identification: Robin Foster MRN:  [id number] Principal Diagnosis: Benzodiazepine overdose Diagnosis:   Patient Active Problem List   Diagnosis Date Noted  . Benzodiazepine overdose [T42.4X1A] 05/08/2015  . Suicidal overdose [T50.902A] 05/08/2015  . Hypokalemia [E87.6] 05/08/2015  . Overdose [T50.901A] 05/07/2015  . Diabetes [E11.9] 05/07/2015  . Anxiety [F41.9] 05/07/2015  . Hypertension [I10] 05/07/2015    Total Time spent with patient: 30 minutes  Subjective:   Robin Foster is a 59 y.o. female patient admitted with  Benzodiazepine overdose   HPI:   Patient is a 59 year old female. Lives alone, unemployed. Husband passed away about a year ago. She was brought to hospital due to Xanax overdose - states she impulsively took about 80  Tablets . She is  Unsure of her motivation. She does state " I was having a bad day", but was not thinking of suicide , and describes depression. She states " I don't think I was trying to die, just to sleep and relax , and disappear for a little while ". ( She does describe symptoms of depression such as some anhedonia, sadness.)  She states she did not lose consciousness, and called family member who came to home and had her brought to hospital. Daughter at bedside and with patient 's consent provided significant collateral information- states patient has history of abusing prescribed BZDs and other medications in the past ( opiates?) . Last year she had a similar episode and went to a residential rehab center in [place], [place], where she stayed for 21 days. Daughter states that  Patient's drug abuse has " changed her personality", and contributed to alienate the family.  Patient acknowledges a history of abusing prescribed Xanax, resulting in running out of this medication prior to next refill at times, so that she develops  increased anxiety and WDL symptoms- she denies any prior history of seizures . Patient's admission UDS Positive for Cocaine and BZDS, negative for Alcohol, negative for Opiates . At this time patient presents depressed, but denies any current SI. She does not present with diaphoresis, agitation, or restlessness. No tremors noted- vitals stable . HPI Elements:   Benzodiazepine Overdose- severe, indeterminate intent, but patient does have symptoms of Major Depression. 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   Past Medical History:  Past Medical History  Diagnosis Date  . Anxiety   . Diabetes mellitus without complication   . ETOH abuse     Past Surgical History  Procedure Laterality Date  . Cholecystectomy     Family History: No family history on file. Social History:  History  Alcohol Use  . Yes    Comment: daily use     History  Drug Use  . Yes    History   Social History  . Marital Status: Married    Spouse Name: N/A  . Number of Children: N/A  . Years of Education: N/A   Social History Main Topics  . Smoking status: Current Every Day Smoker -- 0.50 packs/day    Types: Cigarettes  . Smokeless tobacco: Never Used  . Alcohol Use: Yes     Comment: daily use  . Drug Use: Yes  . Sexual Activity: Not Currently   Other Topics Concern  . None   Social History Narrative   Additional Social History:  Allergies:  No Known Allergies  Labs:  Results for orders placed or performed during the hospital encounter of 05/07/15 (from the past 48 hour(s))  Urinalysis, Routine w reflex microscopic (not at [facility])     Status: None   Collection Time: 05/07/15  7:31 PM  Result Value Ref Range   Color, Urine YELLOW YELLOW   APPearance CLEAR CLEAR   Specific Gravity, Urine 1.010 1.005 - 1.030   pH 6.5 5.0 - 8.0   Glucose, UA NEGATIVE NEGATIVE mg/dL   Hgb urine dipstick NEGATIVE NEGATIVE   Bilirubin Urine NEGATIVE  NEGATIVE   Ketones, ur NEGATIVE NEGATIVE mg/dL   Protein, ur NEGATIVE NEGATIVE mg/dL   Urobilinogen, UA 0.2 0.0 - 1.0 mg/dL   Nitrite NEGATIVE NEGATIVE   Leukocytes, UA NEGATIVE NEGATIVE    Comment: MICROSCOPIC NOT DONE ON URINES WITH NEGATIVE PROTEIN, BLOOD, LEUKOCYTES, NITRITE, OR GLUCOSE <1000 mg/dL.  Urine rapid drug screen (hosp performed)     Status: Abnormal   Collection Time: 05/07/15  7:31 PM  Result Value Ref Range   Opiates NONE DETECTED NONE DETECTED   Cocaine POSITIVE (A) NONE DETECTED   Benzodiazepines POSITIVE (A) NONE DETECTED   Amphetamines NONE DETECTED NONE DETECTED   Tetrahydrocannabinol NONE DETECTED NONE DETECTED   Barbiturates NONE DETECTED NONE DETECTED    Comment:        DRUG SCREEN FOR MEDICAL PURPOSES ONLY.  IF CONFIRMATION IS NEEDED FOR ANY PURPOSE, NOTIFY LAB WITHIN 5 DAYS.        LOWEST DETECTABLE LIMITS FOR URINE DRUG SCREEN Drug Class       Cutoff (ng/mL) Amphetamine      1000 Barbiturate      200 Benzodiazepine   638 Tricyclics       453 Opiates          300 Cocaine          300 THC              50   Comprehensive metabolic panel     Status: Abnormal   Collection Time: 05/07/15  7:48 PM  Result Value Ref Range   Sodium 140 135 - 145 mmol/L   Potassium 3.6 3.5 - 5.1 mmol/L   Chloride 102 101 - 111 mmol/L   CO2 29 22 - 32 mmol/L   Glucose, Bld 123 (H) 65 - 99 mg/dL   BUN 13 6 - 20 mg/dL   Creatinine, Ser 0.78 0.44 - 1.00 mg/dL   Calcium 9.3 8.9 - 10.3 mg/dL   Total Protein 7.3 6.5 - 8.1 g/dL   Albumin 3.9 3.5 - 5.0 g/dL   AST 17 15 - 41 U/L   ALT 12 (L) 14 - 54 U/L   Alkaline Phosphatase 120 38 - 126 U/L   Total Bilirubin 0.7 0.3 - 1.2 mg/dL   GFR calc non Af Amer >60 >60 mL/min   GFR calc Af Amer >60 >60 mL/min    Comment: (NOTE) The eGFR has been calculated using the CKD EPI equation. This calculation has not been validated in all clinical situations. eGFR's persistently <60 mL/min signify possible Chronic Kidney Disease.     Anion gap 9 5 - 15  Ethanol     Status: None   Collection Time: 05/07/15  7:48 PM  Result Value Ref Range   Alcohol, Ethyl (B) <5 <5 mg/dL    Comment:        LOWEST DETECTABLE LIMIT FOR SERUM ALCOHOL IS 5 mg/dL FOR MEDICAL PURPOSES ONLY  CBC with Differential     Status: Abnormal   Collection Time: 05/07/15  7:48 PM  Result Value Ref Range   WBC 11.1 (H) 4.0 - 10.5 K/uL   RBC 4.90 3.87 - 5.11 MIL/uL   Hemoglobin 13.9 12.0 - 15.0 g/dL   HCT 43.9 36.0 - 46.0 %   MCV 89.6 78.0 - 100.0 fL   MCH 28.4 26.0 - 34.0 pg   MCHC 31.7 30.0 - 36.0 g/dL   RDW 13.7 11.5 - 15.5 %   Platelets 237 150 - 400 K/uL   Neutrophils Relative % 68 43 - 77 %   Neutro Abs 7.6 1.7 - 7.7 K/uL   Lymphocytes Relative 25 12 - 46 %   Lymphs Abs 2.8 0.7 - 4.0 K/uL   Monocytes Relative 5 3 - 12 %   Monocytes Absolute 0.6 0.1 - 1.0 K/uL   Eosinophils Relative 1 0 - 5 %   Eosinophils Absolute 0.1 0.0 - 0.7 K/uL   Basophils Relative 0 0 - 1 %   Basophils Absolute 0.0 0.0 - 0.1 K/uL  Acetaminophen level     Status: Abnormal   Collection Time: 05/07/15  7:48 PM  Result Value Ref Range   Acetaminophen (Tylenol), Serum <10 (L) 10 - 30 ug/mL    Comment:        THERAPEUTIC CONCENTRATIONS VARY SIGNIFICANTLY. A RANGE OF 10-30 ug/mL MAY BE AN EFFECTIVE CONCENTRATION FOR MANY PATIENTS. HOWEVER, SOME ARE BEST TREATED AT CONCENTRATIONS OUTSIDE THIS RANGE. ACETAMINOPHEN CONCENTRATIONS >150 ug/mL AT 4 HOURS AFTER INGESTION AND >50 ug/mL AT 12 HOURS AFTER INGESTION ARE OFTEN ASSOCIATED WITH TOXIC REACTIONS.   Salicylate level     Status: None   Collection Time: 05/07/15  7:48 PM  Result Value Ref Range   Salicylate Lvl <2.3 2.8 - 30.0 mg/dL  Blood gas, venous     Status: Abnormal   Collection Time: 05/07/15  7:49 PM  Result Value Ref Range   FIO2 0.21 %   pH, Ven 7.374 (H) 7.250 - 7.300   pCO2, Ven 50.0 45.0 - 50.0 mmHg   pO2, Ven 36.9 30.0 - 45.0 mmHg   Bicarbonate 28.5 (H) 20.0 - 24.0 mEq/L   TCO2 25.2 0  - 100 mmol/L   Acid-Base Excess 2.8 (H) 0.0 - 2.0 mmol/L   O2 Saturation 68.3 %   Patient temperature 98.6    Collection site VEIN    Drawn by COLLECTED BY LABORATORY    Sample type VEIN   I-stat troponin, ED     Status: None   Collection Time: 05/07/15  7:58 PM  Result Value Ref Range   Troponin i, poc 0.00 0.00 - 0.08 ng/mL   Comment 3            Comment: Due to the release kinetics of cTnI, a negative result within the first hours of the onset of symptoms does not rule out myocardial infarction with certainty. If myocardial infarction is still suspected, repeat the test at appropriate intervals.   I-Stat CG4 Lactic Acid, ED     Status: None   Collection Time: 05/07/15  8:01 PM  Result Value Ref Range   Lactic Acid, Venous 1.21 0.5 - 2.0 mmol/L  MRSA PCR Screening     Status: None   Collection Time: 05/07/15 10:00 PM  Result Value Ref Range   MRSA by PCR NEGATIVE NEGATIVE    Comment:        The GeneXpert MRSA Assay (FDA approved for NASAL specimens  only), is one component of a comprehensive MRSA colonization surveillance program. It is not intended to diagnose MRSA infection nor to guide or monitor treatment for MRSA infections.   Glucose, capillary     Status: None   Collection Time: 05/08/15  1:17 AM  Result Value Ref Range   Glucose-Capillary 93 65 - 99 mg/dL  Comprehensive metabolic panel     Status: Abnormal   Collection Time: 05/08/15  3:45 AM  Result Value Ref Range   Sodium 143 135 - 145 mmol/L   Potassium 3.3 (L) 3.5 - 5.1 mmol/L   Chloride 106 101 - 111 mmol/L   CO2 28 22 - 32 mmol/L   Glucose, Bld 110 (H) 65 - 99 mg/dL   BUN 13 6 - 20 mg/dL   Creatinine, Ser 0.68 0.44 - 1.00 mg/dL   Calcium 9.0 8.9 - 10.3 mg/dL   Total Protein 6.7 6.5 - 8.1 g/dL   Albumin 3.7 3.5 - 5.0 g/dL   AST 13 (L) 15 - 41 U/L   ALT 12 (L) 14 - 54 U/L   Alkaline Phosphatase 115 38 - 126 U/L   Total Bilirubin 0.8 0.3 - 1.2 mg/dL   GFR calc non Af Amer >60 >60 mL/min   GFR  calc Af Amer >60 >60 mL/min    Comment: (NOTE) The eGFR has been calculated using the CKD EPI equation. This calculation has not been validated in all clinical situations. eGFR's persistently <60 mL/min signify possible Chronic Kidney Disease.    Anion gap 9 5 - 15  Glucose, capillary     Status: Abnormal   Collection Time: 05/08/15  7:58 AM  Result Value Ref Range   Glucose-Capillary 141 (H) 65 - 99 mg/dL   Comment 1 Notify RN    Comment 2 Document in Chart     Vitals: Blood pressure 116/77, pulse 71, temperature 98.2 F (36.8 C), temperature source Oral, resp. rate 18, height 5' 6"  (1.676 m), SpO2 99 %.  Risk to Self: Is patient at risk for suicide?: Yes Risk to Others:   Prior Inpatient Therapy:   Prior Outpatient Therapy:    Current Facility-Administered Medications  Medication Dose Route Frequency Provider Last Rate Last Dose  . 0.9 %  sodium chloride infusion   Intravenous Continuous Gennaro Africa, MD 100 mL/hr at 05/08/15 0615    . enoxaparin (LOVENOX) injection 40 mg  40 mg Subcutaneous QHS Gennaro Africa, MD   40 mg at 05/07/15 2353  . folic acid (FOLVITE) tablet 1 mg  1 mg Oral Daily Nishant Dhungel, MD   1 mg at 05/08/15 1215  . hydrochlorothiazide (HYDRODIURIL) tablet 25 mg  25 mg Oral Daily Nishant Dhungel, MD   25 mg at 05/08/15 1200  . lisinopril (PRINIVIL,ZESTRIL) tablet 40 mg  40 mg Oral Daily Nishant Dhungel, MD   40 mg at 05/08/15 1200  . LORazepam (ATIVAN) injection 1 mg  1 mg Intravenous Q6H PRN Gennaro Africa, MD   1 mg at 05/08/15 0010  . multivitamin with minerals tablet 1 tablet  1 tablet Oral Daily Nishant Dhungel, MD   1 tablet at 05/08/15 1215  . thiamine (VITAMIN B-1) tablet 100 mg  100 mg Oral Daily Nishant Dhungel, MD   100 mg at 05/08/15 1215   Or  . thiamine (B-1) injection 100 mg  100 mg Intravenous Daily Nishant Dhungel, MD        Musculoskeletal: Strength & Muscle Tone: within normal limits Gait & Station: gait not examined  Patient leans:  N/A  Psychiatric Specialty Exam: Physical Exam  ROS- denies nausea, denies vomiting, denies shortness of breath, denies chest pain  Blood pressure 116/77, pulse 71, temperature 98.2 F (36.8 C), temperature source Oral, resp. rate 18, height 5' 6"  (1.676 m), SpO2 99 %.There is no weight on file to calculate BMI.  General Appearance: Fairly Groomed  Engineer, water::  Good  Speech:  Slow  Volume:  Decreased  Mood:  Depressed  Affect:  constricted, but reactive, tearful at times   Thought Process:  Linear  Orientation:  Full (Time, Place, and Person)  Thought Content:  denies hallucinations, no delusions  Suicidal Thoughts:  No at this time denies current suicidal ideations   Homicidal Thoughts:  No  Memory:  recent and remote fair   Judgement:  Fair  Insight:  Fair  Psychomotor Activity:  Decreased- no tremors, no restlessness or other indication of active BZD withdrawal at this time  Concentration:  Good  Recall:  Good  Fund of Knowledge:Good  Language: Good  Akathisia:  Negative  Handed:  Right  AIMS (if indicated):     Assets:  Desire for Improvement Resilience  ADL's:  Impaired  Cognition: WNL  Sleep:      Medical Decision Making: Established Problem, Stable/Improving (1), Review of Psycho-Social Stressors (1), Review or order clinical lab tests (1) and Review of Medication Regimen & Side Effects (2)    Recommendations   Recommend psychiatric Inpatient admission when medically cleared. Patient is currently agreeing to this , and states she would be willing to come to inpatient psychiatric unit voluntarily Would  Proceed with Benzodiazepine detox protocol to minimize risk of Withdrawal- as noted , currently not presenting with any symptoms of WDL. Consider restarting Cymbalta , which she states she was taking prior to admission, at 30 mg QDAY initially Please contact [facility] on Call if any questions or concerns  [facility], [facility] 05/08/2015 6:16 PM

## 2015-05-08 NOTE — Progress Notes (Signed)
Date:  May 08, 2015 U.R. performed for needs and level of care. Will continue to follow for Case Management needs.  [place]remont Skalicky, RN, BSN, CCM   [phone] 

## 2015-05-08 NOTE — Progress Notes (Signed)
Patient awake and requesting her pocketbook. Her daughter was called and verified that Fleet ContrasJessica Gorely has her pocketbook and cell phone. The patient spoke with her daughter.

## 2015-05-08 NOTE — Progress Notes (Deleted)
Patient remained A/Ox4 throughout the shift. She ambulates with 1 person standby assist. She had no c/o pain or signs of distress. Glucostabilizer and  insulin drip was discontinued shortly before 2am. Potassium level was low, new orders given for supplemental potassium. Pt.began to have c/o sweating, hot flashes, nausea, and restlessness. VS were taken. CBG was taken. Prn medication given for nausea. Asked patient about possible symptoms of withdrawal. Pt.said that these symptoms occur when she withdraws.  On call provider was called and notified. New order written. VS remain stable. Pt.resting in bed.

## 2015-05-08 NOTE — Progress Notes (Signed)
Patient is A/Ox2. She is currently on 4 L [place] of oxygen. C/o pain this am and prn pain medication was given. She is on strict bed rest. Patient became agitated and verbally abusive during the shift it was explained to patient that she has been involuntarily committed and cannot leave the hospital at this time. Orders were given for posey belt however patient became less agitated during the shift and restraints was not needed.

## 2015-05-09 ENCOUNTER — Inpatient Hospital Stay (HOSPITAL_COMMUNITY): Payer: PRIVATE HEALTH INSURANCE

## 2015-05-09 DIAGNOSIS — T50902D Poisoning by unspecified drugs, medicaments and biological substances, intentional self-harm, subsequent encounter: Secondary | ICD-10-CM

## 2015-05-09 DIAGNOSIS — E876 Hypokalemia: Secondary | ICD-10-CM

## 2015-05-09 DIAGNOSIS — T424X4A Poisoning by benzodiazepines, undetermined, initial encounter: Secondary | ICD-10-CM

## 2015-05-09 DIAGNOSIS — E118 Type 2 diabetes mellitus with unspecified complications: Secondary | ICD-10-CM

## 2015-05-09 LAB — BASIC METABOLIC PANEL
Anion gap: 6 (ref 5–15)
BUN: 11 mg/dL (ref 6–20)
CALCIUM: 8.7 mg/dL — AB (ref 8.9–10.3)
CO2: 27 mmol/L (ref 22–32)
CREATININE: 0.75 mg/dL (ref 0.44–1.00)
Chloride: 108 mmol/L (ref 101–111)
GFR calc Af Amer: >60 mL/min (ref >60)
GFR calc non Af Amer: >60 mL/min (ref >60)
GLUCOSE: 162 mg/dL — AB (ref 65–99)
Potassium: 3.6 mmol/L (ref 3.5–5.1)
SODIUM: 141 mmol/L (ref 135–145)

## 2015-05-09 LAB — HEMOGLOBIN A1C
HEMOGLOBIN A1C: 6.8 % — AB (ref 4.8–5.6)
Mean Plasma Glucose: 148 mg/dL

## 2015-05-09 LAB — GLUCOSE, CAPILLARY
GLUCOSE-CAPILLARY: 121 mg/dL — AB (ref 65–99)
Glucose-Capillary: 149 mg/dL — ABNORMAL HIGH (ref 65–99)
Glucose-Capillary: 115 mg/dL — ABNORMAL HIGH (ref 65–99)
Glucose-Capillary: 100 mg/dL — ABNORMAL HIGH (ref 65–99)

## 2015-05-09 MED ORDER — HYDRALAZINE HCL 20 MG/ML IJ SOLN
10.0000 mg | Freq: Once | INTRAMUSCULAR | Status: AC
  Administered 2015-05-09: 10 mg via INTRAVENOUS
  Filled 2015-05-09: qty 1

## 2015-05-09 MED ORDER — ZOLPIDEM TARTRATE 5 MG PO TABS
5.0000 mg | ORAL_TABLET | Freq: Every evening | ORAL | Status: DC | PRN
  Administered 2015-05-09: 5 mg via ORAL
  Filled 2015-05-09: qty 1

## 2015-05-09 MED ORDER — POTASSIUM CHLORIDE CRYS ER 20 MEQ PO TBCR: 40.0000 meq | EXTENDED_RELEASE_TABLET | Freq: Two times a day (BID) | ORAL | Status: DC

## 2015-05-09 MED ORDER — DULOXETINE HCL 30 MG PO CPEP
30.0000 mg | ORAL_CAPSULE | Freq: Every day | ORAL | Status: DC
  Administered 2015-05-09 – 2015-05-10 (×2): 30 mg via ORAL
  Filled 2015-05-09 (×2): qty 1

## 2015-05-09 MED ORDER — HYDROCODONE-ACETAMINOPHEN 5-325 MG PO TABS
1.0000 | ORAL_TABLET | Freq: Four times a day (QID) | ORAL | Status: DC
  Administered 2015-05-09 – 2015-05-10 (×5): 1 via ORAL
  Filled 2015-05-09 (×5): qty 1

## 2015-05-09 NOTE — Progress Notes (Signed)
RN notified PCP r/t B/P 189/120. Awaiting any new orders.

## 2015-05-09 NOTE — Clinical Social Work Psych Assess (Signed)
Clinical Social Work Nature conservation officer  Clinical Social Worker:  Boone Master, Arley Date/Time:  05/09/2015, 1:54 PM Referred By:  Physician Date Referred:  05/09/15 Reason for Referral:  Behavioral Health Issues   Presenting Symptoms/Problems  Presenting Symptoms/Problems(in person's/family's own words):  Psych consulted due to overdose.   Abuse/Neglect/Trauma History  Abuse/Neglect/Trauma History:  Denies History Abuse/Neglect/Trauma History Comments (indicate dates):  N/A   Psychiatric History  Psychiatric History:  Inpatient/Hospitalization Psychiatric Medication:  Xanax   Current Mental Health Hospitalizations/Previous Mental Health History:  Patient reports a long history of anxiety and depression. Patient's psychiatrist moved about 6 months ago so patient's cardiologist was prescribing medications.   Current Provider:  Cardiologist Place and Date:  [place], [place]  Current Medications:   Scheduled Meds: . DULoxetine  30 mg Oral Daily  . enoxaparin (LOVENOX) injection  40 mg Subcutaneous QHS  . folic acid  1 mg Oral Daily  . hydrochlorothiazide  25 mg Oral Daily  . HYDROcodone-acetaminophen  1 tablet Oral 4 times per day  . lisinopril  40 mg Oral Daily  . multivitamin with minerals  1 tablet Oral Daily  . nicotine  21 mg Transdermal Daily  . thiamine  100 mg Oral Daily   Or  . thiamine  100 mg Intravenous Daily   Continuous Infusions: . sodium chloride 100 mL/hr at 05/09/15 1200   PRN Meds:.LORazepam, traMADol, zolpidem     Previous Inpatient Admission/Date/Reason:  Patient reports several hospitalizations at [facility] but reports she cannot remember why she was admitted.   Emotional Health/Current Symptoms  Suicide/Self Harm: Suicide Attempt in the Past (date/description) Suicide Attempt in Past (date/description):  Patient admitted after overdose. Patient reports she was not trying to kill herself but "didn't care what happened."  Patient reports this was first suicide attempt.  Other Harmful Behavior (ex. homicidal ideation) (describe):  None reported   Psychotic/Dissociative Symptoms  Psychotic/Dissociative Symptoms: None Reported Other Psychotic/Dissociative Symptoms:  N/A   Attention/Behavioral Symptoms  Attention/Behavioral Symptoms: Withdrawn Other Attention/Behavioral Symptoms:  Patient withdrawn and guarded throughout assessment.   Cognitive Impairment  Cognitive Impairment:  Within Normal Limits Other Cognitive Impairment:  N/A   Mood and Adjustment  Mood and Adjustment:  Flat   Stress, Anxiety, Trauma, Any Recent Loss/Stressor  Stress, Anxiety, Trauma, Any Recent Loss/Stressor: Anxiety Anxiety (frequency):  Patient reports anxiety was too difficult to manage so she started taking medications again. Patient reports her anxiety has increased husband passed away.  Phobia (specify):  N/A  Compulsive Behavior (specify):  N/A  Obsessive Behavior (specify):  N/A  Other Stress, Anxiety, Trauma, Any Recent Loss/Stressor:  N/A   Substance Abuse/Use  Substance Abuse/Use: Current Substance Use SBIRT Completed (please refer for detailed history): No Self-reported Substance Use (last use and frequency):  Patient reports she took one hit from a crack pipe the day before admission. Patient reports she does not usually use any drugs or alcohol. Patient reports last time she used alcohol was about 1 month ago. Patient's story is conflicting with notes in chart.  Patient denies any SA problems and denies any previous treatment. Patient declined to complete SBIRT.  Urinary Drug Screen Completed: Yes Alcohol Level:  <5   Environment/Housing/Living Arrangement  Environmental/Housing/Living Arrangement: Stable Housing Who is in the Home:  Alone  Emergency Contact:  [place]: Private Insurance   Patient's Strengths and Goals  Patient's Strengths and Goals  (patient's own words):  Patient has received MH treatment in the past and  agreeable to find new psychiatrist in the community for follow up appointments.   Clinical Social Worker's Interpretive Summary  Clinical Social Workers Interpretive Summary:    CSW received referral to complete psychosocial assessment. CSW reviewed chart and psych MD is recommending inpatient placement at [facility]. CSW met with patient at bedside. CSW introduced myself and explained role.  Patient reports that she lives in an apartment complex by herself. Patient has two dtrs and two step sons. Patient's husband passed away unexpectedly last year. Patient reports she has good friends in the apartment complex and usually spends her days with friends. 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 Patient is currently drawing husband's disability check to pay bills. Patient reports strained relationship with children but that dtr has come to visit in the hospital and it was a good visit.  Patient denies any problems with substance use and reports she does not need to discuss use. Patient denies any current alcohol use and only uses cocaine occasionally. Patient reports that she has had to go to [facility] in the past but will not provide an explanation. Patient denies SA treatment and reports this was first suicide attempt.  Patient guarded and withdrawn. Patient is concerned about suicide precautions and is upset that she does not have all the same rights (such as wearing her clothing, having a phone, etc.) that other patients have. Patient is under IVC and inpatient psych hospitalization will be needed once medically stable.   Disposition  Disposition: Recommend Psych CSW Continuing To Support While In [facility] [place]

## 2015-05-09 NOTE — Progress Notes (Signed)
05/08/15 2035 - [facility] [place] [facility]. Informed Denise Pt's VSS, Pt is alert and oriented, and Pt's EKG showed NSR. Angelique BlonderDenise stated she is "closing her out."

## 2015-05-09 NOTE — Progress Notes (Signed)
[facility] PROGRESS NOTE  Robin Foster VQQ:[id number] DOB: 02/14/1956 DOA: 05/07/2015 PCP: Default, Provider, MD  Brief narrative 59 year old female with history of severe depression with recurrent  suicidal ideations and attempts admitted with Xanax overdose. Patient recently failed a new bottle of Xanax 3 days ago (90 tablets) which was empty. Patient this morning reports that she took about 30 tablets of Xanax as she wanted to kill himself due to a lot of family stressors. Daughter also informed that patient takes methadone without prescription. Patient was somnolent upon arrival. Remaining vitals were stable. Patient reports drinking alcohol and urine drug she was positive for cocaine . Patient admitted to stepdown unit her symptoms have improved. Psychiatry consulted and recommendations given. Plan to transfer to telemetry today.    Assessment/Plan: Benzodiazepine overdose Likely associated with alcohol use as well.  Continue Software engineerbedside sitter. Currently awake and oriented. Continue neuro checks. -Continue IV hydration. Monitor for benzodiazepine withdrawal symptoms. Started on when necessary IV Ativan. Psych consulted and recommendations for inpatient psychiatry hospitalization when medically ready.  -Poison control consulted on admission.  Major depression with suicidal attempt - bedside sitter. Cannot leave AMA. Psych consulted.  Hypokalemia Replete as needed.    Hypertension Controlled.   Type 2 diabetes mellitus Hold metformin. Monitor on sliding scale insulin. CBG (last 3)   Recent Labs  05/08/15 0758 05/08/15 2157 05/09/15 0733  GLUCAP 141* 108* 149*      Cocaine abuse Counseled on cessation.  etoh abuse Pt unclear how much she drank. Will monitor on CIWA.  Rib pain: she reports fall a week ago, getting rib series and added pain medication.   Diet: Diabetic DVT prophylaxis: Subcutaneous Lovenox    Code Status: Full code Family Communication: None  at bedside Disposition Plan:  Possibly transfer to telemetry this afternoon.  Need inpatient psych hospitalization once medically stable.   Consultants:  Psychiatry  Procedures:  None  Antibiotics:  None  HPI/Subjective: Patient seen and examined this morning. She is awake and alert. Denies any suicidal ideations at this time.  Reports some rib pain.   Objective: Filed Vitals:   05/09/15 0900  BP: 156/73  Pulse: 79  Temp:   Resp: 13    Intake/Output Summary (Last 24 hours) at 05/09/15 1005 Last data filed at 05/09/15 0827  Gross per 24 hour  Intake   2400 ml  Output    800 ml  Net   1600 ml   There were no vitals filed for this visit.  Exam:   General:  Elderly female in no acute distress  HEENT: No pallor, no icterus, moist oral mucosa, supple neck  Chest: Clear to auscultation bilaterally, no added sounds  CVS: Normal S1 and S2, no murmurs rub or gallop  GI: Soft, nondistended, nontender, bowel sounds present  Musculoskeletal: Warm, no edema  CNS: Alert and oriented, no tremors, nonfocal exam    Data Reviewed: Basic Metabolic Panel:  Recent Labs Lab 05/07/15 1948 05/08/15 0345  NA 140 143  K 3.6 3.3*  CL 102 106  CO2 29 28  GLUCOSE 123* 110*  BUN 13 13  CREATININE 0.78 0.68  CALCIUM 9.3 9.0   Liver Function Tests:  Recent Labs Lab 05/07/15 1948 05/08/15 0345  AST 17 13*  ALT 12* 12*  ALKPHOS 120 115  BILITOT 0.7 0.8  PROT 7.3 6.7  ALBUMIN 3.9 3.7   No results for input(s): LIPASE, AMYLASE in the last 168 hours. No results for input(s): AMMONIA in the last 168  hours. CBC:  Recent Labs Lab 05/07/15 1948  WBC 11.1*  NEUTROABS 7.6  HGB 13.9  HCT 43.9  MCV 89.6  PLT 237   Cardiac Enzymes: No results for input(s): CKTOTAL, CKMB, CKMBINDEX, TROPONINI in the last 168 hours. BNP (last 3 results) No results for input(s): BNP in the last 8760 hours.  ProBNP (last 3 results) No results for input(s): PROBNP in the last 8760  hours.  CBG:  Recent Labs Lab 05/08/15 0117 05/08/15 0758 05/08/15 2157 05/09/15 0733  GLUCAP 93 141* 108* 149*    Recent Results (from the past 240 hour(s))  MRSA PCR Screening     Status: None   Collection Time: 05/07/15 10:00 PM  Result Value Ref Range Status   MRSA by PCR NEGATIVE NEGATIVE Final    Comment:        The GeneXpert MRSA Assay (FDA approved for NASAL specimens only), is one component of a comprehensive MRSA colonization surveillance program. It is not intended to diagnose MRSA infection nor to guide or monitor treatment for MRSA infections.      Studies: Dg Chest Port 1 View  05/07/2015   CLINICAL DATA:  Short of breath today.  Overdose last night.  EXAM: PORTABLE CHEST - 1 VIEW  COMPARISON:  None.  FINDINGS: Artifact overlies chest. The patient has taken a poor inspiration. Heart size is normal allowing for the technical factors. There is mild volume loss at both lung bases. Upper lungs are clear.  IMPRESSION: Mild volume loss at the lung bases.   Electronically Signed   By: Paulina Fusi M.D.   On: 05/07/2015 20:31    Scheduled Meds: . DULoxetine  30 mg Oral Daily  . enoxaparin (LOVENOX) injection  40 mg Subcutaneous QHS  . folic acid  1 mg Oral Daily  . hydrochlorothiazide  25 mg Oral Daily  . HYDROcodone-acetaminophen  1 tablet Oral 4 times per day  . lisinopril  40 mg Oral Daily  . multivitamin with minerals  1 tablet Oral Daily  . nicotine  21 mg Transdermal Daily  . thiamine  100 mg Oral Daily   Or  . thiamine  100 mg Intravenous Daily   Continuous Infusions: . sodium chloride 100 mL/hr at 05/08/15 0615      Time spent: 25 minutes    Rydge Texidor  [facility] Pager [phone] If 7PM-7AM, please contact night-coverage at [email or web address], password [facility] 05/09/2015, 10:05 AM  LOS: 2 days

## 2015-05-10 ENCOUNTER — Encounter (HOSPITAL_COMMUNITY): Payer: Self-pay | Admitting: *Deleted

## 2015-05-10 ENCOUNTER — Inpatient Hospital Stay (HOSPITAL_COMMUNITY)
Admission: AD | Admit: 2015-05-10 | Discharge: 2015-05-14 | DRG: 885 | Disposition: A | Discharge status: Home / Self Care | Payer: PRIVATE HEALTH INSURANCE | Source: Intra-hospital | Attending: Psychiatry | Admitting: Psychiatry

## 2015-05-10 DIAGNOSIS — T424X4A Poisoning by benzodiazepines, undetermined, initial encounter: Secondary | ICD-10-CM | POA: Diagnosis not present

## 2015-05-10 DIAGNOSIS — R5383 Other fatigue: Secondary | ICD-10-CM | POA: Diagnosis present

## 2015-05-10 DIAGNOSIS — F1320 Sedative, hypnotic or anxiolytic dependence, uncomplicated: Secondary | ICD-10-CM | POA: Diagnosis present

## 2015-05-10 DIAGNOSIS — F1020 Alcohol dependence, uncomplicated: Secondary | ICD-10-CM | POA: Diagnosis not present

## 2015-05-10 DIAGNOSIS — F322 Major depressive disorder, single episode, severe without psychotic features: Secondary | ICD-10-CM | POA: Diagnosis present

## 2015-05-10 DIAGNOSIS — T424X2D Poisoning by benzodiazepines, intentional self-harm, subsequent encounter: Secondary | ICD-10-CM

## 2015-05-10 DIAGNOSIS — F1010 Alcohol abuse, uncomplicated: Secondary | ICD-10-CM | POA: Diagnosis present

## 2015-05-10 DIAGNOSIS — R296 Repeated falls: Secondary | ICD-10-CM | POA: Diagnosis present

## 2015-05-10 DIAGNOSIS — I10 Essential (primary) hypertension: Secondary | ICD-10-CM

## 2015-05-10 DIAGNOSIS — F329 Major depressive disorder, single episode, unspecified: Secondary | ICD-10-CM | POA: Diagnosis not present

## 2015-05-10 DIAGNOSIS — G8929 Other chronic pain: Secondary | ICD-10-CM | POA: Diagnosis present

## 2015-05-10 DIAGNOSIS — F418 Other specified anxiety disorders: Secondary | ICD-10-CM | POA: Diagnosis present

## 2015-05-10 DIAGNOSIS — F321 Major depressive disorder, single episode, moderate: Secondary | ICD-10-CM | POA: Diagnosis not present

## 2015-05-10 DIAGNOSIS — F10239 Alcohol dependence with withdrawal, unspecified: Secondary | ICD-10-CM | POA: Diagnosis not present

## 2015-05-10 DIAGNOSIS — F17210 Nicotine dependence, cigarettes, uncomplicated: Secondary | ICD-10-CM | POA: Diagnosis present

## 2015-05-10 DIAGNOSIS — F411 Generalized anxiety disorder: Secondary | ICD-10-CM | POA: Diagnosis present

## 2015-05-10 DIAGNOSIS — N390 Urinary tract infection, site not specified: Secondary | ICD-10-CM | POA: Diagnosis present

## 2015-05-10 DIAGNOSIS — R0781 Pleurodynia: Secondary | ICD-10-CM | POA: Diagnosis present

## 2015-05-10 DIAGNOSIS — E119 Type 2 diabetes mellitus without complications: Secondary | ICD-10-CM | POA: Diagnosis present

## 2015-05-10 DIAGNOSIS — F1410 Cocaine abuse, uncomplicated: Secondary | ICD-10-CM | POA: Diagnosis present

## 2015-05-10 HISTORY — DX: Essential (primary) hypertension: I10

## 2015-05-10 LAB — BASIC METABOLIC PANEL
Anion gap: 11 (ref 5–15)
BUN: 8 mg/dL (ref 6–20)
CO2: 27 mmol/L (ref 22–32)
CREATININE: 0.72 mg/dL (ref 0.44–1.00)
Calcium: 9.4 mg/dL (ref 8.9–10.3)
Chloride: 102 mmol/L (ref 101–111)
GLUCOSE: 135 mg/dL — AB (ref 65–99)
Potassium: 3.2 mmol/L — ABNORMAL LOW (ref 3.5–5.1)
Sodium: 140 mmol/L (ref 135–145)

## 2015-05-10 LAB — GLUCOSE, CAPILLARY
GLUCOSE-CAPILLARY: 118 mg/dL — AB (ref 65–99)
Glucose-Capillary: 128 mg/dL — ABNORMAL HIGH (ref 65–99)

## 2015-05-10 MED ORDER — AMLODIPINE BESYLATE 5 MG PO TABS: 5.0000 mg | ORAL_TABLET | Freq: Every day | ORAL | Status: DC

## 2015-05-10 MED ORDER — MAGNESIUM HYDROXIDE 400 MG/5ML PO SUSP: 30.0000 mL | Freq: Every day | ORAL | Status: DC | PRN

## 2015-05-10 MED ORDER — DULOXETINE HCL 30 MG PO CPEP
30.0000 mg | ORAL_CAPSULE | Freq: Every day | ORAL | Status: DC
  Administered 2015-05-11: 30 mg via ORAL
  Filled 2015-05-10 (×4): qty 1

## 2015-05-10 MED ORDER — METFORMIN HCL 500 MG PO TABS: 500.0000 mg | ORAL_TABLET | Freq: Two times a day (BID) | ORAL | Status: DC

## 2015-05-10 MED ORDER — HYDRALAZINE HCL 20 MG/ML IJ SOLN: 5.0000 mg | Freq: Four times a day (QID) | INTRAMUSCULAR | Status: DC | PRN

## 2015-05-10 MED ORDER — IBUPROFEN 600 MG PO TABS
600.0000 mg | ORAL_TABLET | Freq: Four times a day (QID) | ORAL | Status: DC | PRN
  Administered 2015-05-10 – 2015-05-13 (×3): 600 mg via ORAL
  Filled 2015-05-10 (×3): qty 1

## 2015-05-10 MED ORDER — DULOXETINE HCL 30 MG PO CPEP: 30.0000 mg | ORAL_CAPSULE | Freq: Every day | ORAL | Status: DC

## 2015-05-10 MED ORDER — ADULT MULTIVITAMIN W/MINERALS CH: 1.0000 | ORAL_TABLET | Freq: Every day | ORAL | Status: DC

## 2015-05-10 MED ORDER — FOLIC ACID 1 MG PO TABS
1.0000 mg | ORAL_TABLET | Freq: Every day | ORAL | Status: DC
  Administered 2015-05-11 – 2015-05-14 (×4): 1 mg via ORAL
  Filled 2015-05-10 (×7): qty 1

## 2015-05-10 MED ORDER — AMLODIPINE BESYLATE 5 MG PO TABS
5.0000 mg | ORAL_TABLET | Freq: Every day | ORAL | Status: DC
  Filled 2015-05-10 (×4): qty 1

## 2015-05-10 MED ORDER — LORAZEPAM 1 MG PO TABS
1.0000 mg | ORAL_TABLET | Freq: Four times a day (QID) | ORAL | Status: DC | PRN
  Administered 2015-05-11 – 2015-05-14 (×6): 1 mg via ORAL
  Filled 2015-05-10 (×8): qty 1

## 2015-05-10 MED ORDER — VITAMIN B-1 100 MG PO TABS
100.0000 mg | ORAL_TABLET | Freq: Every day | ORAL | Status: DC
  Administered 2015-05-11 – 2015-05-14 (×4): 100 mg via ORAL
  Filled 2015-05-10 (×7): qty 1

## 2015-05-10 MED ORDER — METFORMIN HCL 500 MG PO TABS
500.0000 mg | ORAL_TABLET | Freq: Two times a day (BID) | ORAL | Status: DC
  Filled 2015-05-10: qty 1

## 2015-05-10 MED ORDER — ALUM & MAG HYDROXIDE-SIMETH 200-200-20 MG/5ML PO SUSP: 30.0000 mL | ORAL | Status: DC | PRN

## 2015-05-10 MED ORDER — FOLIC ACID 1 MG PO TABS: 1.0000 mg | ORAL_TABLET | Freq: Every day | ORAL | Status: DC

## 2015-05-10 MED ORDER — GABAPENTIN 400 MG PO CAPS
800.0000 mg | ORAL_CAPSULE | Freq: Three times a day (TID) | ORAL | Status: DC
  Administered 2015-05-10: 800 mg via ORAL
  Filled 2015-05-10 (×3): qty 2

## 2015-05-10 MED ORDER — NICOTINE 21 MG/24HR TD PT24: 21.0000 mg | MEDICATED_PATCH | Freq: Every day | TRANSDERMAL | Status: DC

## 2015-05-10 MED ORDER — GABAPENTIN 400 MG PO CAPS
800.0000 mg | ORAL_CAPSULE | Freq: Three times a day (TID) | ORAL | Status: DC
  Administered 2015-05-10 – 2015-05-14 (×12): 800 mg via ORAL
  Filled 2015-05-10 (×4): qty 2
  Filled 2015-05-10 (×2): qty 18
  Filled 2015-05-10 (×6): qty 2
  Filled 2015-05-10: qty 18
  Filled 2015-05-10 (×6): qty 2

## 2015-05-10 MED ORDER — POTASSIUM CHLORIDE CRYS ER 20 MEQ PO TBCR
40.0000 meq | EXTENDED_RELEASE_TABLET | Freq: Two times a day (BID) | ORAL | Status: DC
  Administered 2015-05-10: 40 meq via ORAL
  Filled 2015-05-10: qty 2

## 2015-05-10 MED ORDER — ACETAMINOPHEN 325 MG PO TABS
650.0000 mg | ORAL_TABLET | Freq: Four times a day (QID) | ORAL | Status: DC | PRN
  Administered 2015-05-10 – 2015-05-12 (×2): 650 mg via ORAL
  Filled 2015-05-10 (×2): qty 2

## 2015-05-10 MED ORDER — TRAMADOL HCL 50 MG PO TABS: 50.0000 mg | ORAL_TABLET | Freq: Four times a day (QID) | ORAL | Status: AC | PRN

## 2015-05-10 MED ORDER — TRAZODONE HCL 50 MG PO TABS
50.0000 mg | ORAL_TABLET | Freq: Every evening | ORAL | Status: DC | PRN
  Administered 2015-05-10 – 2015-05-13 (×6): 50 mg via ORAL
  Filled 2015-05-10: qty 1
  Filled 2015-05-10: qty 6
  Filled 2015-05-10 (×5): qty 1

## 2015-05-10 MED ORDER — GABAPENTIN 400 MG PO CAPS: 800.0000 mg | ORAL_CAPSULE | Freq: Three times a day (TID) | ORAL | Status: DC

## 2015-05-10 MED ORDER — NICOTINE 21 MG/24HR TD PT24
21.0000 mg | MEDICATED_PATCH | Freq: Every day | TRANSDERMAL | Status: DC
  Administered 2015-05-11 – 2015-05-14 (×4): 21 mg via TRANSDERMAL
  Filled 2015-05-10 (×2): qty 1
  Filled 2015-05-10: qty 14
  Filled 2015-05-10 (×4): qty 1

## 2015-05-10 MED ORDER — LISINOPRIL 40 MG PO TABS
40.0000 mg | ORAL_TABLET | Freq: Every day | ORAL | Status: DC
  Administered 2015-05-11 – 2015-05-14 (×4): 40 mg via ORAL
  Filled 2015-05-10 (×6): qty 1

## 2015-05-10 MED ORDER — THIAMINE HCL 100 MG PO TABS: 100.0000 mg | ORAL_TABLET | Freq: Every day | ORAL | Status: DC

## 2015-05-10 MED ORDER — HYDROXYZINE HCL 50 MG PO TABS
50.0000 mg | ORAL_TABLET | Freq: Four times a day (QID) | ORAL | Status: DC | PRN
  Administered 2015-05-10 – 2015-05-14 (×5): 50 mg via ORAL
  Filled 2015-05-10 (×6): qty 1

## 2015-05-10 MED ORDER — AMLODIPINE BESYLATE 5 MG PO TABS
5.0000 mg | ORAL_TABLET | Freq: Every day | ORAL | Status: DC
  Administered 2015-05-10: 5 mg via ORAL
  Filled 2015-05-10 (×2): qty 1

## 2015-05-10 MED ORDER — METFORMIN HCL 500 MG PO TABS
500.0000 mg | ORAL_TABLET | Freq: Two times a day (BID) | ORAL | Status: DC
  Administered 2015-05-11 – 2015-05-14 (×6): 500 mg via ORAL
  Filled 2015-05-10 (×12): qty 1

## 2015-05-10 NOTE — Progress Notes (Signed)
[name]  Awaiting police escort since patient on suicide precautions and involuntary commitment

## 2015-05-10 NOTE — Discharge Summary (Signed)
Physician Discharge Summary  Robin Foster ZOX:[id number] DOB: 1956/02/17 DOA: 05/07/2015  PCP: Default, Provider, MD  Admit date: 05/07/2015 Discharge date: 05/10/2015  Time spent: 30 minutes  Recommendations for Outpatient Follow-up:  1. Follow upw ith PCP in one week.   Discharge Diagnoses:  Principal Problem:   Benzodiazepine overdose Active Problems:   Overdose   Diabetes   Anxiety   Hypertension   Suicidal overdose   Hypokalemia   Discharge Condition: improved    Diet recommendation: carb modified diet.   Filed Weights   05/09/15 1303  Weight: 82.192 kg (181 lb 3.2 oz)    History of present illness:  59 year old female with history of severe depression with recurrent suicidal ideations and attempts admitted with Xanax overdose. Patient recently failed a new bottle of Xanax 3 days ago (90 tablets) which was empty. Patient this morning reports that she took about 30 tablets of Xanax as she wanted to kill himself due to a lot of family stressors. Daughter also informed that patient takes methadone without prescription. Patient was somnolent upon arrival. Remaining vitals were stable. Patient reports drinking alcohol and urine drug she was positive for cocaine . Patient admitted to stepdown unit her symptoms have improved. Psychiatry consulted and recommendations given. Plan to transfer [facility] today.   Hospital Course:  Benzodiazepine overdose Likely associated with alcohol use as well.  Currently awake and oriented. Continue neuro checks. Psych consulted and recommendations for inpatient psychiatry hospitalization when medically ready.    Major depression with suicidal attempt - bedside sitter. Cannot leave AMA. Psych consulted.  Hypokalemia Replete as needed.    Hypertension Controlled.   Type 2 diabetes mellitus Resume metformin.  CBG (last 3)   Recent Labs (last 2 labs)      Recent Labs  05/08/15 0758 05/08/15 2157 05/09/15 0733  GLUCAP 141*  108* 149*        Cocaine abuse Counseled on cessation.  etoh abuse No withdrawals.   Rib pain: she reports fall a week ago, rib series show subtle old fracture. Pain control.        Procedures:  none  Consultations:  psychiatry  Discharge Exam: Filed Vitals:   05/10/15 0600  BP: 161/78  Pulse: 73  Temp: 97.7 F (36.5 C)  Resp: 16    General: Alert afebrile comfortable Cardiovascular: s1s2 Respiratory: ctab  Discharge Instructions   Discharge Instructions    Diet Carb Modified    Complete by:  As directed      Discharge instructions    Complete by:  As directed   Follow up with PCP IN ONE WEEK.          Current Discharge Medication List    START taking these medications   Details  amLODipine (NORVASC) 5 MG tablet Take 1 tablet (5 mg total) by mouth daily. Qty: 30 tablet    DULoxetine (CYMBALTA) 30 MG capsule Take 1 capsule (30 mg total) by mouth daily. Refills: 3    folic acid (FOLVITE) 1 MG tablet Take 1 tablet (1 mg total) by mouth daily.    gabapentin (NEURONTIN) 400 MG capsule Take 2 capsules (800 mg total) by mouth 3 (three) times daily.    metFORMIN (GLUCOPHAGE) 500 MG tablet Take 1 tablet (500 mg total) by mouth 2 (two) times daily with a meal.    Multiple Vitamin (MULTIVITAMIN WITH MINERALS) TABS tablet Take 1 tablet by mouth daily.    nicotine (NICODERM CQ - DOSED IN MG/24 HOURS) 21 mg/24hr patch Place 1  patch (21 mg total) onto the skin daily. Qty: 28 patch, Refills: 0    thiamine 100 MG tablet Take 1 tablet (100 mg total) by mouth daily.    traMADol (ULTRAM) 50 MG tablet Take 1 tablet (50 mg total) by mouth every 6 (six) hours as needed for moderate pain or severe pain. Qty: 10 tablet, Refills: 0      CONTINUE these medications which have NOT CHANGED   Details  lisinopril (PRINIVIL,ZESTRIL) 40 MG tablet Take 40 mg by mouth daily.      STOP taking these medications     hydrochlorothiazide (HYDRODIURIL) 25 MG tablet         No Known Allergies    The results of significant diagnostics from this hospitalization (including imaging, microbiology, ancillary and laboratory) are listed below for reference.    Significant Diagnostic Studies: Dg Ribs Unilateral W/chest Right  05/09/2015   CLINICAL DATA:  Recent overdose.  Fall.  Initial evaluation .  EXAM: RIGHT RIBS AND CHEST - 3+ VIEW  COMPARISON:  None.  FINDINGS: Subtle fracture of the right second rib cannot be excluded. No displaced fracture noted . No pneumothorax. Cardiomegaly.  IMPRESSION: Subtle fracture of right second rib cannot be excluded. No displaced fracture noted.   Electronically Signed   By: Maisie Fus  Register   On: 05/09/2015 11:11   Dg Chest Port 1 View  05/07/2015   CLINICAL DATA:  Short of breath today.  Overdose last night.  EXAM: PORTABLE CHEST - 1 VIEW  COMPARISON:  None.  FINDINGS: Artifact overlies chest. The patient has taken a poor inspiration. Heart size is normal allowing for the technical factors. There is mild volume loss at both lung bases. Upper lungs are clear.  IMPRESSION: Mild volume loss at the lung bases.   Electronically Signed   By: Paulina Fusi M.D.   On: 05/07/2015 20:31    Microbiology: Recent Results (from the past 240 hour(s))  MRSA PCR Screening     Status: None   Collection Time: 05/07/15 10:00 PM  Result Value Ref Range Status   MRSA by PCR NEGATIVE NEGATIVE Final    Comment:        The GeneXpert MRSA Assay (FDA approved for NASAL specimens only), is one component of a comprehensive MRSA colonization surveillance program. It is not intended to diagnose MRSA infection nor to guide or monitor treatment for MRSA infections.      Labs: Basic Metabolic Panel:  Recent Labs Lab 05/07/15 1948 05/08/15 0345 05/09/15 1021 05/10/15 0445  NA 140 143 141 140  K 3.6 3.3* 3.6 3.2*  CL 102 106 108 102  CO2 GLUCOSE 123* 110* 162* 135*  BUN CREATININE 0.78 0.68 0.75 0.72  CALCIUM 9.3 9.0  8.7* 9.4   Liver Function Tests:  Recent Labs Lab 05/07/15 1948 05/08/15 0345  AST 17 13*  ALT 12* 12*  ALKPHOS 120 115  BILITOT 0.7 0.8  PROT 7.3 6.7  ALBUMIN 3.9 3.7   No results for input(s): LIPASE, AMYLASE in the last 168 hours. No results for input(s): AMMONIA in the last 168 hours. CBC:  Recent Labs Lab 05/07/15 1948  WBC 11.1*  NEUTROABS 7.6  HGB 13.9  HCT 43.9  MCV 89.6  PLT 237   Cardiac Enzymes: No results for input(s): CKTOTAL, CKMB, CKMBINDEX, TROPONINI in the last 168 hours. BNP: BNP (last 3 results) No results for input(s): BNP in the last 8760 hours.  ProBNP (  last 3 results) No results for input(s): PROBNP in the last 8760 hours.  CBG:  Recent Labs Lab 05/09/15 1156 05/09/15 1638 05/09/15 2119 05/10/15 0745 05/10/15 1150  GLUCAP 121* 115* 100* 128* 118*       Signed:  Rowan Blaker  [facility] 05/10/2015, 1:13 PM

## 2015-05-10 NOTE — Progress Notes (Signed)
Pt admitted involuntary following overdose. Pt reports that OD was from valium and report called reported OD on xanax. Pt reports that she uses methadone and cocaine. She lives in a senior living center. Pt reports that stressors are losing her husband one year ago. She has a medical hx of diabetes, HTN, anxiety and substance abuse. Pt reports recent falls. She has family hx of suicide including her brother and nephew. Pt reports on admission that she did not intend to kill herself "just sleep."

## 2015-05-10 NOTE — Plan of Care (Signed)
Problem: Phase III Progression Outcomes Goal: Discharge plan remains appropriate-arrangements made Outcome: Adequate for Discharge Patient discharged to [facility].

## 2015-05-10 NOTE — Progress Notes (Signed)
Clinical Social Work  Patient accepted to [facility] room 306-2. RN to call report to [phone]. CSW faxed IVC paperwork to [facility] who reviewed and agreeable to accept patient. CSW met with patient and explained DC plans. Patient aware of transfer and reports she will contact dtr to update her on plans. CSW coordinated transportation via [employer] due to patient being under IVC.  CSW is signing off but available if needed.  [place], [place] [phone]

## 2015-05-10 NOTE — Progress Notes (Signed)
Report received from admission RN Jan W. Assumed pt care at 1745. D: Pt's mood and affect is pleasant and blunted. Pt states "I need to get on a new anti-depressant, the one I'm on now just isn't working. I also have a lot of anxiety." Pt is observed active in the milieu.  A: Active listening by RN. Encouragement/Support provided to pt. NP May made aware of pt's arrival to the unit. 15 minute checks continued per protocol for patient safety.  R: Patient cooperative and receptive to nursing interventions. Pt remains safe.

## 2015-05-10 NOTE — Progress Notes (Signed)
Clinical Social Work  CSW was informed that patient is medically stable to DC. CSW contacted the following facilities:  [facility]- admissions Olivia Mackie(Tressa) is reviewing information and will call CSW if they can accept patient  [facility]-  [facility] reports possible DC this afternoon and will call CSW if they can accept  [facility]- available beds. Referral faxed  [facility]- no [facility] beds  [facility]- unsure of bed status. Referral faxed.  [facility]- [facility]. Referral faxed.  CSW will continue to follow.  [place]olly Eilah [place], [place] [phone]

## 2015-05-10 NOTE — Tx Team (Addendum)
Initial Interdisciplinary Treatment Plan   PATIENT STRESSORS: Loss of husband  Substance abuse   PATIENT STRENGTHS: General fund of knowledge Physical Health Supportive family/friends   PROBLEM LIST: Problem List/Patient Goals Date to be addressed Date deferred Reason deferred Estimated date of resolution  Substance abuse 05/10/15     grief 05/10/15     Suicide attempt/depression 05/10/15     anxiety 05/10/15           "I need to get a new medication for my depression and anxiety; the one I take now isn't working"                         DISCHARGE CRITERIA:  Ability to meet basic life and health needs Adequate post-discharge living arrangements Improved stabilization in mood, thinking, and/or behavior Medical problems require only outpatient monitoring Motivation to continue treatment in a less acute level of care Need for constant or close observation no longer present Reduction of life-threatening or endangering symptoms to within safe limits Safe-care adequate arrangements made Verbal commitment to aftercare and medication compliance Withdrawal symptoms are absent or subacute and managed without 24-hour nursing intervention  PRELIMINARY DISCHARGE PLAN: Outpatient therapy Return to previous living arrangement  PATIENT/FAMIILY INVOLVEMENT: This treatment plan has been presented to and reviewed with the patient, Robin Foster, and/or family member,   The patient and family have been given the opportunity to ask questions and make suggestions.  Beatrix ShipperWright, Jan Martin 05/10/2015, 5:00 PM

## 2015-05-10 NOTE — Progress Notes (Signed)
Pt attended karaoke group. Pt was engaged however did not sing a song. 

## 2015-05-10 NOTE — Care Management Note (Signed)
Case Management Note  Patient Details  Name: Robin Foster MRN: [id number] Date of Birth: 04/05/1956  Subjective/Objective:                    Action/Plan:d/c Inpt Psych facility. No further d/c needs.   Expected Discharge Date:                  Expected Discharge Plan:  [facility]  In-House Referral:  Clinical Social Work  Discharge planning Services  CM Consult  Post Acute Care Choice:  NA Choice offered to:  NA  DME Arranged:  N/A DME Agency:  NA  HH Arranged:  NA HH Agency:  NA  Status of Service:  Completed, signed off  Medicare Important Message Given:    Date Medicare IM Given:    Medicare IM give by:    Date Additional Medicare IM Given:    Additional Medicare Important Message give by:     If discussed at Long Length of Stay Meetings, dates discussed:    Additional Comments:  Lanier ClamMahabir, Kristy [place], RN 05/10/2015, 1:17 PM

## 2015-05-11 ENCOUNTER — Encounter (HOSPITAL_COMMUNITY): Payer: Self-pay | Admitting: Psychiatry

## 2015-05-11 DIAGNOSIS — F322 Major depressive disorder, single episode, severe without psychotic features: Principal | ICD-10-CM

## 2015-05-11 DIAGNOSIS — F1010 Alcohol abuse, uncomplicated: Secondary | ICD-10-CM | POA: Diagnosis present

## 2015-05-11 DIAGNOSIS — F1410 Cocaine abuse, uncomplicated: Secondary | ICD-10-CM

## 2015-05-11 DIAGNOSIS — F411 Generalized anxiety disorder: Secondary | ICD-10-CM | POA: Diagnosis present

## 2015-05-11 LAB — URINALYSIS, ROUTINE W REFLEX MICROSCOPIC
BILIRUBIN URINE: NEGATIVE
GLUCOSE, UA: NEGATIVE mg/dL
Ketones, ur: NEGATIVE mg/dL
LEUKOCYTES UA: NEGATIVE
NITRITE: NEGATIVE
Protein, ur: NEGATIVE mg/dL
Specific Gravity, Urine: 1.023 (ref 1.005–1.030)
Urobilinogen, UA: 0.2 mg/dL (ref 0.0–1.0)
pH: 5.5 (ref 5.0–8.0)

## 2015-05-11 LAB — GLUCOSE, CAPILLARY
Glucose-Capillary: 243 mg/dL — ABNORMAL HIGH (ref 65–99)
Glucose-Capillary: 161 mg/dL — ABNORMAL HIGH (ref 65–99)
Glucose-Capillary: 144 mg/dL — ABNORMAL HIGH (ref 65–99)
Glucose-Capillary: 307 mg/dL — ABNORMAL HIGH (ref 65–99)
Glucose-Capillary: 118 mg/dL — ABNORMAL HIGH (ref 65–99)

## 2015-05-11 LAB — URINE MICROSCOPIC-ADD ON

## 2015-05-11 MED ORDER — CIPROFLOXACIN HCL 250 MG PO TABS
250.0000 mg | ORAL_TABLET | Freq: Two times a day (BID) | ORAL | Status: AC
  Administered 2015-05-11 – 2015-05-14 (×6): 250 mg via ORAL
  Filled 2015-05-11 (×7): qty 1

## 2015-05-11 MED ORDER — AMLODIPINE BESYLATE 5 MG PO TABS
5.0000 mg | ORAL_TABLET | Freq: Every day | ORAL | Status: DC
  Administered 2015-05-11 – 2015-05-13 (×3): 5 mg via ORAL
  Filled 2015-05-11 (×5): qty 1

## 2015-05-11 MED ORDER — TRAMADOL HCL 50 MG PO TABS
50.0000 mg | ORAL_TABLET | Freq: Four times a day (QID) | ORAL | Status: DC | PRN
  Administered 2015-05-11 – 2015-05-14 (×6): 50 mg via ORAL
  Filled 2015-05-11 (×7): qty 1

## 2015-05-11 MED ORDER — DULOXETINE HCL 60 MG PO CPEP
60.0000 mg | ORAL_CAPSULE | Freq: Every day | ORAL | Status: DC
  Administered 2015-05-12: 60 mg via ORAL
  Filled 2015-05-11 (×3): qty 1

## 2015-05-11 NOTE — Progress Notes (Signed)
During the first part of the shift, pt was occupied with peers and attended evening karaoke.  After the patients returned to the unit, pt began to c/o symptoms of a UTI.  She reported burning with urination, and pain in her lower abdomen.  A specimen was obtained and NP was called for an order for a UA.  Pt was given Motrin for the pain, and Vistaril 50 mg for her anxiety.  Pt is wanting the stronger pain medications that she received in the hospital [facility].  Pt was informed that tonight Motrin was the only pain med besides Tylenol that would be available to her, and she could talk to the MD tomorrow for any changes.  Pt voiced understanding.  Pt denies SI/HI/AVH at this time.  She makes her needs known to staff.  Support and encouragement offered.  Safety maintained with q15 minute checks.

## 2015-05-11 NOTE — H&P (Signed)
Psychiatric Admission Assessment Adult  Patient Identification: Robin Foster MRN:  [id number] Date of Evaluation:  05/11/2015 Chief Complaint:  Depression Anxiety Principal Diagnosis: MDD (major depressive disorder) Diagnosis:   Patient Active Problem List   Diagnosis Date Noted  . MDD (major depressive disorder) [F32.2] 05/10/2015  . Benzodiazepine overdose [T42.4X1A] 05/08/2015  . Suicidal overdose [T50.902A] 05/08/2015  . Hypokalemia [E87.6] 05/08/2015  . Overdose [T50.901A] 05/07/2015  . Diabetes [E11.9] 05/07/2015  . Anxiety [F41.9] 05/07/2015  . Hypertension [I10] 05/07/2015   History of Present Illness: Robin Foster is a 59 y.o. female patient admitted with Benzo OD.  Initial reports note that it was from Xanax OD.  However, patient stated that it was from Valium.  "I didn't want to die.  I just wanted to get some sleep."  Patient has been suffering from insomnia through the years.  She has two grown children and she was most close to the one that lived in [place].  She also stated that she has been having vivid dreams lately, not violent, but of old friends and classmates and going to the spa.  She also finds sadness in knowing and realizing that a lot of her friends she know are dying and she feels lonely from it.  Per ED HPI: Patient is a 59 year old female. Lives alone, unemployed. Husband passed away about a year ago. She was brought to hospital due to Xanax overdose - states she impulsively took about 80 Tablets . She is Unsure of her motivation. She does state " I was having a bad day", but was not thinking of suicide , and describes depression. She states " I don't think I was trying to die, just to sleep and relax , and disappear for a little while ". ( She does describe symptoms of depression such as some anhedonia, sadness.)  She states she did not lose consciousness, and called family member who came to home and had her brought to hospital. Daughter at  bedside and with patient 's consent provided significant collateral information- states patient has history of abusing prescribed BZDs and other medications in the past ( opiates?) . Last year she had a similar episode and went to a residential rehab center in [place], [place], where she stayed for 21 days. Daughter states that Patient's drug abuse has " changed her personality", and contributed to alienate the family.  Patient acknowledges a history of abusing prescribed Xanax, resulting in running out of this medication prior to next refill at times, so that she develops increased anxiety and WDL symptoms- she denies any prior history of seizures . Patient's admission UDS Positive for Cocaine and BZDS, negative for Alcohol, negative for Opiates . At this time patient presents depressed, but denies any current SI. She does not present with diaphoresis, agitation, or restlessness. No tremors noted- vitals stable .  HPI Elements: Benzodiazepine Overdose- severe, indeterminate intent, but patient does have symptoms of Major Depression. 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   Associated Signs/Symptoms: Depression Symptoms:  depressed mood, insomnia, anxiety, loss of energy/fatigue, disturbed sleep, (Hypo) Manic Symptoms:  Irritable Mood, Labiality of Mood, Anxiety Symptoms:  Excessive Worry, Social Anxiety, Psychotic Symptoms:  NA PTSD Symptoms: NA Total Time spent with patient: 45 minutes  Past Medical History:  Past Medical History  Diagnosis Date  . Anxiety   . Diabetes mellitus without complication   . ETOH abuse   . Hypertension     Past  Surgical History  Procedure Laterality Date  . Cholecystectomy     Family History: History reviewed. No pertinent family history. Social History:  History  Alcohol Use  . Yes    Comment: daily use     History  Drug Use  . Yes  . Special: Cocaine, Other-see comments    Comment: methodone    History    Social History  . Marital Status: Married    Spouse Name: N/A  . Number of Children: N/A  . Years of Education: N/A   Social History Main Topics  . Smoking status: Current Every Day Smoker -- 1.00 packs/day for 40 years    Types: Cigarettes  . Smokeless tobacco: Never Used  . Alcohol Use: Yes     Comment: daily use  . Drug Use: Yes    Special: Cocaine, Other-see comments     Comment: methodone  . Sexual Activity: Not Currently   Other Topics Concern  . None   Social History Narrative   Additional Social History:    Pain Medications: methadone History of alcohol / drug use?: Yes Longest period of sobriety (when/how long): 5 yrs    Musculoskeletal: Strength & Muscle Tone: within normal limits Gait & Station: normal Patient leans: N/A  Psychiatric Specialty Exam: Physical Exam  Vitals reviewed. Psychiatric: Her speech is normal and behavior is normal. Thought content normal. Her mood appears anxious. She expresses impulsivity.    Review of Systems  Constitutional: Negative for fever.  Eyes: Negative for blurred vision.  Respiratory: Negative for cough.   Cardiovascular: Negative for chest pain.  Gastrointestinal: Negative for heartburn.  Genitourinary: Negative for dysuria.  Musculoskeletal: Negative for myalgias.  Skin: Negative for rash.  Neurological: Negative for dizziness and headaches.  Endo/Heme/Allergies: Does not bruise/bleed easily.  All other systems reviewed and are negative.   Blood pressure 127/81, pulse 75, temperature 98.2 F (36.8 C), temperature source Oral, resp. rate 16, height 5\' 6"  (1.676 m), weight 78.472 kg (173 lb), SpO2 95 %.Body mass index is 27.94 kg/(m^2).   General Appearance: Fairly Groomed  Patent attorney:: Good  Speech: Slow  Volume: Decreased  Mood: Depressed  Affect: constricted, but reactive, tearful at times   Thought Process: Linear  Orientation: Full (Time, Place, and Person)  Thought Content: denies  hallucinations, no delusions  Suicidal Thoughts: No at this time denies current suicidal ideations   Homicidal Thoughts: No  Memory: recent and remote fair   Judgement: Fair  Insight: Fair  Psychomotor Activity: Decreased- no tremors, no restlessness or other indication of active BZD withdrawal at this time  Concentration: Good  Recall: Good  Fund of Knowledge:Good  Language: Good  Akathisia: Negative  Handed: Right  AIMS (if indicated):    Assets: Desire for Improvement Resilience  ADL's: Impaired  Cognition: WNL  Sleep:  5.75       Risk to Self: Is patient at risk for suicide?: Yes Risk to Others:   Prior Inpatient Therapy:   Prior Outpatient Therapy:    Alcohol Screening: 1. How often do you have a drink containing alcohol?: 2 to 3 times a week 2. How many drinks containing alcohol do you have on a typical day when you are drinking?: 5 or 6 3. How often do you have six or more drinks on one occasion?: Less than monthly Preliminary Score: 3 4. How often during the last year have you found that you were not able to stop drinking once you had started?: Monthly 5. How often during  the last year have you failed to do what was normally expected from you becasue of drinking?: Monthly 6. How often during the last year have you needed a first drink in the morning to get yourself going after a heavy drinking session?: Less than monthly 7. How often during the last year have you had a feeling of guilt of remorse after drinking?: Weekly 8. How often during the last year have you been unable to remember what happened the night before because you had been drinking?: Less than monthly 9. Have you or someone else been injured as a result of your drinking?: No 10. Has a relative or friend or a doctor or another health worker been concerned about your drinking or suggested you cut down?: No Alcohol Use Disorder Identification Test Final Score (AUDIT): 15 Brief  Intervention: Yes  Allergies:  No Known Allergies Lab Results:  Results for orders placed or performed during the hospital encounter of 05/10/15 (from the past 48 hour(s))  Glucose, capillary     Status: Abnormal   Collection Time: 05/10/15  9:59 PM  Result Value Ref Range   Glucose-Capillary 243 (H) 65 - 99 mg/dL   Comment 1 Notify RN   Urinalysis, Routine w reflex microscopic (not at [facility])     Status: Abnormal   Collection Time: 05/11/15  5:00 AM  Result Value Ref Range   Color, Urine YELLOW YELLOW   APPearance CLEAR CLEAR   Specific Gravity, Urine 1.023 1.005 - 1.030   pH 5.5 5.0 - 8.0   Glucose, UA NEGATIVE NEGATIVE mg/dL   Hgb urine dipstick LARGE (A) NEGATIVE   Bilirubin Urine NEGATIVE NEGATIVE   Ketones, ur NEGATIVE NEGATIVE mg/dL   Protein, ur NEGATIVE NEGATIVE mg/dL   Urobilinogen, UA 0.2 0.0 - 1.0 mg/dL   Nitrite NEGATIVE NEGATIVE   Leukocytes, UA NEGATIVE NEGATIVE    Comment: Performed at [facility]  Urine microscopic-add on     Status: Abnormal   Collection Time: 05/11/15  5:00 AM  Result Value Ref Range   Squamous Epithelial / LPF FEW (A) RARE   WBC, UA 3-6 <3 WBC/hpf   RBC / HPF 3-6 <3 RBC/hpf   Crystals CA OXALATE CRYSTALS (A) NEGATIVE   Urine-Other AMORPHOUS URATES/PHOSPHATES     Comment: URINALYSIS PERFORMED ON SUPERNATANT Performed at [facility]   Glucose, capillary     Status: Abnormal   Collection Time: 05/11/15  6:09 AM  Result Value Ref Range   Glucose-Capillary 161 (H) 65 - 99 mg/dL  Glucose, capillary     Status: Abnormal   Collection Time: 05/11/15 12:18 PM  Result Value Ref Range   Glucose-Capillary 144 (H) 65 - 99 mg/dL   Comment 1 Document in Chart    Current Medications: Current Facility-Administered Medications  Medication Dose Route Frequency Provider Last Rate Last Dose  . acetaminophen (TYLENOL) tablet 650 mg  650 mg Oral Q6H PRN Worthy Flank, NP   650 mg at 05/10/15 2150  . alum & mag  hydroxide-simeth (MAALOX/MYLANTA) 200-200-20 MG/5ML suspension 30 mL  30 mL Oral Q4H PRN Worthy Flank, NP      . amLODipine (NORVASC) tablet 5 mg  5 mg Oral Daily Worthy Flank, NP   Stopped at 05/11/15 0901  . DULoxetine (CYMBALTA) DR capsule 30 mg  30 mg Oral Daily Worthy Flank, NP   30 mg at 05/11/15 0854  . folic acid (FOLVITE) tablet 1 mg  1 mg Oral Daily Ijeoma  Renelda Loma, NP   1 mg at 05/11/15 0854  . gabapentin (NEURONTIN) capsule 800 mg  800 mg Oral TID Worthy Flank, NP   800 mg at 05/11/15 1316  . hydrOXYzine (ATARAX/VISTARIL) tablet 50 mg  50 mg Oral Q6H PRN Worthy Flank, NP   50 mg at 05/10/15 2319  . ibuprofen (ADVIL,MOTRIN) tablet 600 mg  600 mg Oral Q6H PRN Worthy Flank, NP   600 mg at 05/10/15 2319  . lisinopril (PRINIVIL,ZESTRIL) tablet 40 mg  40 mg Oral Daily Worthy Flank, NP   40 mg at 05/11/15 0852  . LORazepam (ATIVAN) tablet 1 mg  1 mg Oral Q6H PRN Worthy Flank, NP      . magnesium hydroxide (MILK OF MAGNESIA) suspension 30 mL  30 mL Oral Daily PRN Worthy Flank, NP      . metFORMIN (GLUCOPHAGE) tablet 500 mg  500 mg Oral BID WC Worthy Flank, NP   Stopped at 05/11/15 0901  . nicotine (NICODERM CQ - dosed in mg/24 hours) patch 21 mg  21 mg Transdermal Daily Worthy Flank, NP   21 mg at 05/11/15 0859  . thiamine (VITAMIN B-1) tablet 100 mg  100 mg Oral Daily Worthy Flank, NP   100 mg at 05/11/15 0854  . traZODone (DESYREL) tablet 50 mg  50 mg Oral QHS PRN Worthy Flank, NP   50 mg at 05/10/15 2246   PTA Medications: Prescriptions prior to admission  Medication Sig Dispense Refill Last Dose  . amLODipine (NORVASC) 5 MG tablet Take 1 tablet (5 mg total) by mouth daily. 30 tablet    . DULoxetine (CYMBALTA) 30 MG capsule Take 1 capsule (30 mg total) by mouth daily.  3   . folic acid (FOLVITE) 1 MG tablet Take 1 tablet (1 mg total) by mouth daily.     Marland Kitchen gabapentin (NEURONTIN) 400 MG capsule Take 2 capsules (800 mg total) by mouth 3 (three) times  daily.     Marland Kitchen lisinopril (PRINIVIL,ZESTRIL) 40 MG tablet Take 40 mg by mouth daily.   05/07/2015 at Unknown time  . metFORMIN (GLUCOPHAGE) 500 MG tablet Take 1 tablet (500 mg total) by mouth 2 (two) times daily with a meal.     . Multiple Vitamin (MULTIVITAMIN WITH MINERALS) TABS tablet Take 1 tablet by mouth daily.     . nicotine (NICODERM CQ - DOSED IN MG/24 HOURS) 21 mg/24hr patch Place 1 patch (21 mg total) onto the skin daily. 28 patch 0   . thiamine 100 MG tablet Take 1 tablet (100 mg total) by mouth daily.     . traMADol (ULTRAM) 50 MG tablet Take 1 tablet (50 mg total) by mouth every 6 (six) hours as needed for moderate pain or severe pain. 10 tablet 0     Previous Psychotropic Medications: Yes   Substance Abuse History in the last 12 months:  No.    Consequences of Substance Abuse: NA  Results for orders placed or performed during the hospital encounter of 05/10/15 (from the past 72 hour(s))  Glucose, capillary     Status: Abnormal   Collection Time: 05/10/15  9:59 PM  Result Value Ref Range   Glucose-Capillary 243 (H) 65 - 99 mg/dL   Comment 1 Notify RN   Urinalysis, Routine w reflex microscopic (not at [facility])     Status: Abnormal   Collection Time: 05/11/15  5:00 AM  Result Value Ref Range   Color, Urine YELLOW  YELLOW   APPearance CLEAR CLEAR   Specific Gravity, Urine 1.023 1.005 - 1.030   pH 5.5 5.0 - 8.0   Glucose, UA NEGATIVE NEGATIVE mg/dL   Hgb urine dipstick LARGE (A) NEGATIVE   Bilirubin Urine NEGATIVE NEGATIVE   Ketones, ur NEGATIVE NEGATIVE mg/dL   Protein, ur NEGATIVE NEGATIVE mg/dL   Urobilinogen, UA 0.2 0.0 - 1.0 mg/dL   Nitrite NEGATIVE NEGATIVE   Leukocytes, UA NEGATIVE NEGATIVE    Comment: [facility]  Urine microscopic-add on     Status: Abnormal   Collection Time: 05/11/15  5:00 AM  Result Value Ref Range   Squamous Epithelial / LPF FEW (A) RARE   WBC, UA 3-6 <3 WBC/hpf   RBC / HPF 3-6 <3 RBC/hpf   Crystals CA  OXALATE CRYSTALS (A) NEGATIVE   Urine-Other AMORPHOUS URATES/PHOSPHATES     Comment: URINALYSIS PERFORMED ON SUPERNATANT [facility]formed [facility]   Glucose, capillary     Status: Abnormal   Collection Time: 05/11/15  6:09 AM  Result Value Ref Range   Glucose-Capillary 161 (H) 65 - 99 mg/dL  Glucose, capillary     Status: Abnormal   Collection Time: 05/11/15 12:18 PM  Result Value Ref Range   Glucose-Capillary 144 (H) 65 - 99 mg/dL   Comment 1 Document in Chart     Observation Level/Precautions:  15 minute checks  Laboratory:  per ED  Psychotherapy:  Group  Medications:  As per medlist  Consultations:  As needed  Discharge Concerns:  Safety  Estimated LOS:  5-7 days  Other:     Psychological Evaluations: Yes   Treatment Plan Summary: Admit for crisis management and mood stabilization. Medication management to re-stabilize current mood symptoms Group counseling sessions for coping skills Medical consults as needed Review and reinstate any pertinent home medications for other health problems   Medical Decision Making:  Review of Psycho-Social Stressors (1), Decision to obtain old records (1), Review and summation of old records (2), Review of Medication Regimen & Side Effects (2) and Review of New Medication or Change in Dosage (2)  I certify that inpatient services furnished can reasonably be expected to improve the patient's condition.   Velna HatchetSheila May Agustin AGNP-BC 7/8/20162:12 PM I personally assessed the patient, reviewed the physical exam and labs and formulated the treatment plan Madie RenoIrving A. Dub MikesLugo, M.D.

## 2015-05-11 NOTE — Progress Notes (Addendum)
D) Pt. Spent morning in bed stating she was feeling "fatigued".  Pt. Refused glucophage and refused norvasc, stating [facility] as she takes it at home. "because it helps me sleep".  Pt. Up by midday, more animated. Pt. Offering multiple somatic complaints.  Pt. Seeking out staff for frequent requests including PRN medications. CBG elevated at dinner.  A) Pt. Offered support and medicated per order.  Diet education reviewed.  Pt. Strongly encouraged to take all doses of glucophage as ordered.  Glucophage given for dinner dose.  R) Pt. Receptive and demonstrated appreciation for care.

## 2015-05-11 NOTE — Progress Notes (Signed)
Pt attended the evening AA speaker meeting and was engaged and participated in group discussion.

## 2015-05-11 NOTE — Progress Notes (Signed)
Pt has been observed on the unit interacting with peers, putting together a puzzle in the activity room and watching TV in the dayroom after evening group.  Pt reports she is doing a little better this evening.  She was started on Cipro this evening for her UTI symptoms.  She reports she is still having abd pain as well as rib pain from a fall prior to admission. She as ordered tramadol for the pain and received a dose shortly before shift change.  Pt denies SI/HI/AVH at this time.  She denies any withdrawal symptoms.  Pt may go to long term rehab, but she is not sure.  She wants to stay away from drugs.  Support and encouragement offered.  Pt makes his needs known to staff.  Safety maintained with q15 minute checks.
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
## 2015-05-11 NOTE — Tx Team (Signed)
Interdisciplinary Treatment Plan Update (Adult)  Date:  05/11/2015 Time Reviewed:  8:56 AM  Progress in Treatment: Attending groups: No. Participating in groups:  No. Taking medication as prescribed:  Yes. Tolerating medication:  Yes. Family/Significant othe contact made:  No, will contact:  collateral w patient consent Patient understands diagnosis:  Yes. Discussing patient identified problems/goals with staff:  Yes. Medical problems stabilized or resolved:  Yes. Denies suicidal/homicidal ideation: No, contract for safety on unit Issues/concerns per patient self-inventory:  No. Other:  New problem(s) identified: Yes, Describe:  new to unit, assessing  Discharge Plan or Barriers:  CSW assessing  Reason for Continuation of Hospitalization: Depression, Suicide attempt  Comments:  Patient admitted for suicide attempt by overdose, depression, benzodiazepine overdose.  Patient new to unit, CSW assessing for resources/needs.  Estimated length of stay: 3 - 5 days  New goal(s):  New admit, assessing for needs/resources  Review of initial/current patient goals per problem list:  See initial plan of care  Attendees: Patient:   7/8/20168:56 AM  Family:   7/8/20168:56 AM  Physician:  Jola BaptistI Lugo, MD 7/8/20168:56 AM  Nursing:   Shelda JakesPatty Duke, RN 7/8/20168:56 AM  Case Manager:  Santa GeneraAnne Cunningham, LCSW 7/8/20168:56 AM  Counselor:  Ronda Fairlyatherine HArrill, LCSW 7/8/20168:56 AM  Other:  Onnie BoerJennifer Clark, LCSW 7/8/20168:56 AM  Other:   7/8/20168:56 AM  Other:   7/8/20168:56 AM  Other:  7/8/20168:56 AM  Other:  7/8/20168:56 AM  Other:  7/8/20168:56 AM  Other:  7/8/20168:56 AM  Other:  7/8/20168:56 AM  Other:  7/8/20168:56 AM  Other:   7/8/20168:56 AM   Scribe for Treatment Team:   Sallee Langeunningham, Anne C, 05/11/2015, 8:56 AM

## 2015-05-12 DIAGNOSIS — F321 Major depressive disorder, single episode, moderate: Secondary | ICD-10-CM

## 2015-05-12 DIAGNOSIS — F1020 Alcohol dependence, uncomplicated: Secondary | ICD-10-CM

## 2015-05-12 LAB — GLUCOSE, CAPILLARY
GLUCOSE-CAPILLARY: 149 mg/dL — AB (ref 65–99)
GLUCOSE-CAPILLARY: 161 mg/dL — AB (ref 65–99)
Glucose-Capillary: 124 mg/dL — ABNORMAL HIGH (ref 65–99)
Glucose-Capillary: 180 mg/dL — ABNORMAL HIGH (ref 65–99)

## 2015-05-12 MED ORDER — CLONIDINE HCL 0.2 MG PO TABS
0.2000 mg | ORAL_TABLET | Freq: Once | ORAL | Status: AC
  Administered 2015-05-12: 0.2 mg via ORAL
  Filled 2015-05-12: qty 2
  Filled 2015-05-12: qty 1

## 2015-05-12 MED ORDER — DULOXETINE HCL 30 MG PO CPEP
90.0000 mg | ORAL_CAPSULE | Freq: Every day | ORAL | Status: DC
  Administered 2015-05-13 – 2015-05-14 (×2): 90 mg via ORAL
  Filled 2015-05-12: qty 3
  Filled 2015-05-12: qty 9
  Filled 2015-05-12 (×2): qty 3

## 2015-05-12 MED ORDER — ARIPIPRAZOLE 2 MG PO TABS
2.0000 mg | ORAL_TABLET | Freq: Every day | ORAL | Status: DC
  Administered 2015-05-12 – 2015-05-13 (×2): 2 mg via ORAL
  Filled 2015-05-12 (×2): qty 1
  Filled 2015-05-12: qty 3
  Filled 2015-05-12: qty 1

## 2015-05-12 NOTE — Progress Notes (Signed)
D.  Pt pleasant on approach, complaints of rib pain and anxiety.  Pt also concerned about her continued high blood pressure.  States she received Clonidine over at the hospital for this.  Positive for evening wrap up group, interacting appropriately with peers on the unit.  Denies SI/HI/hallucinations at this time.  A.  Support and encouragement offered, called NP regarding Pt's continued high blood pressure and order received and carried for manual blood pressures in each arm.  After these were taken and came back high also, order received for Clonidine 0.2 mg.  R.  Pt pleased with new order, will continue to monitor.

## 2015-05-12 NOTE — [facility] Group Notes (Signed)
[place] Group Notes:  (Clinical Social Work)  05/12/2015     10-11AM  Summary of Progress/Problems:   The main focus of today's process group was to learn how to use a decisional balance exercise to move forward in the Stages of Change, which were described and discussed.  Motivational Interviewing and a white board were utilized to help patients explore in depth the perceived benefits and costs of unhealthy coping techniques, as well as the  benefits and costs of replacing that with a healthy coping skills.   The patient expressed that their unhealthy coping involves drugs and alcohol.  She participated heavily in the group discussion, became tearful talking about her deceased mother at one point.  She asked what she can do for her pain if not using drugs or alcohol, and the group discussed using physical activity like tai chi.  Type of Therapy:  Group Therapy - Process   Participation Level:  Active  Participation Quality:  Attentive and Sharing  Affect:  Depressed and Flat  Cognitive:  Alert  Insight:  Developing/Improving  Engagement in Therapy:  Engaged  Modes of Intervention:  Education, Motivational Interviewing  Selmer Dominion, LCSW 05/12/2015, 12:16 PM

## 2015-05-12 NOTE — Progress Notes (Signed)
[facility] MD Progress Note  05/12/2015 12:24 PM Robin Foster  MRN:  [id number] Subjective:  "I'm still anxious.  Dr Dub Mikes mentioned increasing my Cymbalta and adding Abilify." Objective:  59 Y/O female who states she had taken Xanax since she was 35.  Husband died 05-09-23 last year. Started drinking started doing crack.  She has gotten more and more depressed. Took 80 Valiums.  Today, patient seen.  She is calm and appeared more bright.  She stated that she was still anxious and was hurting in her ribs from her recent fall.  Principal Problem: MDD (major depressive disorder) Diagnosis:   Patient Active Problem List   Diagnosis Date Noted  . Alcohol abuse [F10.10] 05/11/2015  . Cocaine abuse [F14.10] 05/11/2015  . Generalized anxiety disorder [F41.1] 05/11/2015  . MDD (major depressive disorder) [F32.2] 05/10/2015  . Benzodiazepine overdose [T42.4X1A] 05/08/2015  . Suicidal overdose [T50.902A] 05/08/2015  . Hypokalemia [E87.6] 05/08/2015  . Overdose [T50.901A] 05/07/2015  . Diabetes [E11.9] 05/07/2015  . Anxiety [F41.9] 05/07/2015  . Hypertension [I10] 05/07/2015   Total Time spent with patient: 45 minutes   Past Medical History:  Past Medical History  Diagnosis Date  . Anxiety   . Diabetes mellitus without complication   . ETOH abuse   . Hypertension     Past Surgical History  Procedure Laterality Date  . Cholecystectomy     Family History: History reviewed. No pertinent family history. Social History:  History  Alcohol Use  . Yes    Comment: daily use     History  Drug Use  . Yes  . Special: Cocaine, Other-see comments    Comment: methodone    History   Social History  . Marital Status: Married    Spouse Name: N/A  . Number of Children: N/A  . Years of Education: N/A   Social History Main Topics  . Smoking status: Current Every Day Smoker -- 1.00 packs/day for 40 years    Types: Cigarettes  . Smokeless tobacco: Never Used  . Alcohol Use: Yes     Comment:  daily use  . Drug Use: Yes    Special: Cocaine, Other-see comments     Comment: methodone  . Sexual Activity: Not Currently   Other Topics Concern  . None   Social History Narrative   Additional History:    Sleep: Good  Appetite:  Good   Assessment:   Musculoskeletal: Strength & Muscle Tone: within normal limits Gait & Station: normal Patient leans: N/A   Psychiatric Specialty Exam: Physical Exam  Vitals reviewed.   Review of Systems  All other systems reviewed and are negative.   Blood pressure 165/89, pulse 77, temperature 98.1 F (36.7 C), temperature source Oral, resp. rate 16, height 5\' 6"  (1.676 m), weight 78.472 kg (173 lb), SpO2 95 %.Body mass index is 27.94 kg/(m^2).   General Appearance: Fairly Groomed  Patent attorney:: Fair  Speech: Clear and Coherent  Volume: Normal  Mood: Anxious and Depressed  Affect: Depressed and Tearful  Thought Process: Coherent and Goal Directed  Orientation: Full (Time, Place, and Person)  Thought Content: symptoms events worries concerns  Suicidal Thoughts: No  Homicidal Thoughts: No  Memory: Immediate; Fair Recent; Fair Remote; Fair  Judgement: Fair  Insight: Present  Psychomotor Activity: Restlessness  Concentration: Fair  Recall: [employer] of Knowledge:Fair  Language: Fair  Akathisia: No  Handed: Right  AIMS (if indicated):    Assets: Desire for Improvement  Sleep: Number of Hours: 5.75  Cognition: WNL  ADL's: Intact        Current Medications: Current Facility-Administered Medications  Medication Dose Route Frequency Provider Last Rate Last Dose  . acetaminophen (TYLENOL) tablet 650 mg  650 mg Oral Q6H PRN Worthy FlankIjeoma E Nwaeze, NP   650 mg at 05/12/15 1108  . alum & mag hydroxide-simeth (MAALOX/MYLANTA) 200-200-20 MG/5ML suspension 30 mL  30 mL Oral Q4H PRN Worthy FlankIjeoma E Nwaeze, NP      . amLODipine (NORVASC) tablet 5 mg  5 mg Oral Q2000 Rachael FeeIrving A Lugo, MD   5 mg  at 05/11/15 2141  . ciprofloxacin (CIPRO) tablet 250 mg  250 mg Oral BID Adonis BrookSheila Gerturde Kuba, NP   250 mg at 05/12/15 0759  . DULoxetine (CYMBALTA) DR capsule 60 mg  60 mg Oral Daily Rachael FeeIrving A Lugo, MD   60 mg at 05/12/15 0759  . folic acid (FOLVITE) tablet 1 mg  1 mg Oral Daily Worthy FlankIjeoma E Nwaeze, NP   1 mg at 05/12/15 0759  . gabapentin (NEURONTIN) capsule 800 mg  800 mg Oral TID Worthy FlankIjeoma E Nwaeze, NP   800 mg at 05/12/15 1108  . hydrOXYzine (ATARAX/VISTARIL) tablet 50 mg  50 mg Oral Q6H PRN Worthy FlankIjeoma E Nwaeze, NP   50 mg at 05/11/15 2141  . ibuprofen (ADVIL,MOTRIN) tablet 600 mg  600 mg Oral Q6H PRN Worthy FlankIjeoma E Nwaeze, NP   600 mg at 05/10/15 2319  . lisinopril (PRINIVIL,ZESTRIL) tablet 40 mg  40 mg Oral Daily Worthy FlankIjeoma E Nwaeze, NP   40 mg at 05/12/15 0759  . LORazepam (ATIVAN) tablet 1 mg  1 mg Oral Q6H PRN Worthy FlankIjeoma E Nwaeze, NP   1 mg at 05/12/15 0521  . magnesium hydroxide (MILK OF MAGNESIA) suspension 30 mL  30 mL Oral Daily PRN Worthy FlankIjeoma E Nwaeze, NP      . metFORMIN (GLUCOPHAGE) tablet 500 mg  500 mg Oral BID WC Worthy FlankIjeoma E Nwaeze, NP   500 mg at 05/12/15 0759  . nicotine (NICODERM CQ - dosed in mg/24 hours) patch 21 mg  21 mg Transdermal Daily Worthy FlankIjeoma E Nwaeze, NP   21 mg at 05/12/15 0800  . thiamine (VITAMIN B-1) tablet 100 mg  100 mg Oral Daily Worthy FlankIjeoma E Nwaeze, NP   100 mg at 05/12/15 0759  . traMADol (ULTRAM) tablet 50 mg  50 mg Oral Q6H PRN Rachael FeeIrving A Lugo, MD   50 mg at 05/12/15 0809  . traZODone (DESYREL) tablet 50 mg  50 mg Oral QHS PRN Worthy FlankIjeoma E Nwaeze, NP   50 mg at 05/11/15 2307    Lab Results:  Results for orders placed or performed during the hospital encounter of 05/10/15 (from the past 48 hour(s))  Glucose, capillary     Status: Abnormal   Collection Time: 05/10/15  9:59 PM  Result Value Ref Range   Glucose-Capillary 243 (H) 65 - 99 mg/dL   Comment 1 Notify RN   Urinalysis, Routine w reflex microscopic (not at [facility])     Status: Abnormal   Collection Time: 05/11/15  5:00 AM  Result Value Ref Range    Color, Urine YELLOW YELLOW   APPearance CLEAR CLEAR   Specific Gravity, Urine 1.023 1.005 - 1.030   pH 5.5 5.0 - 8.0   Glucose, UA NEGATIVE NEGATIVE mg/dL   Hgb urine dipstick LARGE (A) NEGATIVE   Bilirubin Urine NEGATIVE NEGATIVE   Ketones, ur NEGATIVE NEGATIVE mg/dL   Protein, ur NEGATIVE NEGATIVE mg/dL   Urobilinogen, UA 0.2 0.0 - 1.0 mg/dL  Nitrite NEGATIVE NEGATIVE   Leukocytes, UA NEGATIVE NEGATIVE    Comment: Performed at [facility]  Urine microscopic-add on     Status: Abnormal   Collection Time: 05/11/15  5:00 AM  Result Value Ref Range   Squamous Epithelial / LPF FEW (A) RARE   WBC, UA 3-6 <3 WBC/hpf   RBC / HPF 3-6 <3 RBC/hpf   Crystals CA OXALATE CRYSTALS (A) NEGATIVE   Urine-Other AMORPHOUS URATES/PHOSPHATES     Comment: URINALYSIS PERFORMED ON SUPERNATANT Performed at [facility]   Glucose, capillary     Status: Abnormal   Collection Time: 05/11/15  6:09 AM  Result Value Ref Range   Glucose-Capillary 161 (H) 65 - 99 mg/dL  Glucose, capillary     Status: Abnormal   Collection Time: 05/11/15 12:18 PM  Result Value Ref Range   Glucose-Capillary 144 (H) 65 - 99 mg/dL   Comment 1 Document in Chart   Glucose, capillary     Status: Abnormal   Collection Time: 05/11/15  5:10 PM  Result Value Ref Range   Glucose-Capillary 307 (H) 65 - 99 mg/dL  Glucose, capillary     Status: Abnormal   Collection Time: 05/11/15  9:34 PM  Result Value Ref Range   Glucose-Capillary 118 (H) 65 - 99 mg/dL  Glucose, capillary     Status: Abnormal   Collection Time: 05/12/15  6:04 AM  Result Value Ref Range   Glucose-Capillary 124 (H) 65 - 99 mg/dL    Physical Findings: AIMS: Facial and Oral Movements Muscles of Facial Expression: None, normal Lips and Perioral Area: None, normal Jaw: None, normal Tongue: None, normal,Extremity Movements Upper (arms, wrists, hands, fingers): None, normal Lower (legs, knees, ankles, toes): None, normal,  Trunk Movements Neck, shoulders, hips: None, normal, Overall Severity Severity of abnormal movements (highest score from questions above): None, normal Incapacitation due to abnormal movements: None, normal Patient's awareness of abnormal movements (rate only patient's report): No Awareness, Dental Status Current problems with teeth and/or dentures?: No Does patient usually wear dentures?: No  CIWA:  CIWA-Ar Total: 5 COWS:  COWS Total Score: 9  Treatment Plan Summary: Supportive approach/coping skills  Alcohol abuse/dependence; will monitor for withdrawal  Depression; Increased the Cymbalta to 90 mg (taking 60) and added Abilify 2 mg to augment. She cant take Wellbutrin due to side effects Will work on grief and loss ( still grieving the death of her husband)  Work a relapse prevention plan Use CBT/mindfulness Pain; will use Tramadol 50 mg Q 6 PRN.  Ibuprofen 600 mg Q6 H PRN   Medical Decision Making:  Review of Psycho-Social Stressors (1), Discuss test with performing physician (1), Decision to obtain old records (1) and Review of Medication Regimen & Side Effects (2)  Velna Hatchet May Jeffie Spivack AGNP-BC 05/12/2015, 12:24 PM
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
## 2015-05-12 NOTE — Progress Notes (Signed)
Robin Foster is seen OOB UAL on the 300 hall today...tolerated well. She  Requests and is given tramadol prn for her c/o hurting broken ribs. She slept the majority of the afternoon after taking . She endorses a flat, blunted affect. She completes her self inventory and on it she wrote  She rated her depression, hopelessness and anxiety " 6/6/7".   A She is started on Abilify  2 mg QHS and her cymbalta is increased tomorrow to 90 mg.  She denies SI .   R Safety is in place.

## 2015-05-12 NOTE — Progress Notes (Signed)
[facility] Group Notes:  (Nursing/MHT/Case Management/Adjunct)  Date:  05/12/2015  Time:  2100  Type of Therapy:  wrap up group  Participation Level:  Active  Participation Quality:  Appropriate, Attentive, Sharing and Supportive  Affect:  Appropriate  Cognitive:  Lacking  Insight:  Improving  Engagement in Group:  Engaged  Modes of Intervention:  Clarification, Education and Support  Summary of Progress/Problems:  Shelah LewandowskySquires, Christianna Belmonte Carol 05/12/2015, 11:31 PM

## 2015-05-12 NOTE — [facility] Group Notes (Signed)
[facility] Group Notes:  (Nursing/MHT/Case Management/Adjunct)  Date:  05/12/2015  Time:  9:48 AM  Type of Therapy:  Nurse Education  Participation Level:  Active  Participation Quality:  Appropriate  Affect:  Appropriate  Cognitive:  Alert  Insight:  Appropriate  Engagement in Group:  Engaged  Modes of Intervention:  Education  Summary of Progress/Problems:  Robin Foster, Robin Foster 05/12/2015, 9:48 AM

## 2015-05-13 ENCOUNTER — Encounter (HOSPITAL_COMMUNITY): Payer: Self-pay | Admitting: Registered Nurse

## 2015-05-13 DIAGNOSIS — F329 Major depressive disorder, single episode, unspecified: Secondary | ICD-10-CM

## 2015-05-13 DIAGNOSIS — F10239 Alcohol dependence with withdrawal, unspecified: Secondary | ICD-10-CM

## 2015-05-13 LAB — GLUCOSE, CAPILLARY
Glucose-Capillary: 131 mg/dL — ABNORMAL HIGH (ref 65–99)
Glucose-Capillary: 123 mg/dL — ABNORMAL HIGH (ref 65–99)

## 2015-05-13 MED ORDER — CLONIDINE HCL 0.2 MG PO TABS
0.2000 mg | ORAL_TABLET | Freq: Once | ORAL | Status: AC
  Administered 2015-05-13: 0.2 mg via ORAL
  Filled 2015-05-13: qty 2
  Filled 2015-05-13: qty 1

## 2015-05-13 NOTE — [facility] Group Notes (Signed)
[facility] Group Notes:  Relapse prevention  Date:  05/13/2015  Time:  12:39 PM  Type of Therapy:  Nurse Education  Participation Level:  Active  Participation Quality:  Appropriate  Affect:  Appropriate  Cognitive:  Appropriate  Insight:  Appropriate  Engagement in Group:  Engaged  Modes of Intervention:  Discussion  Summary of Progress/Problems:  Nicole CellaWebb, Cyerra Yim Guyes 05/13/2015, 12:39 PM
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
## 2015-05-13 NOTE — [facility] Group Notes (Signed)
[facility] Group Notes: (Clinical Social Work)   05/13/2015      Type of Therapy:  Group Therapy   Participation Level:  Did Not Attend despite MHT prompting   Ambrose MantleMareida Grossman-Orr, LCSW 05/13/2015, 12:24 PM

## 2015-05-13 NOTE — Progress Notes (Signed)
[facility]  05/13/2015 3:07 PM Robin Foster  MRN:  [id number]    Subjective: Patient stats that she continues to feel a little depressed.      Objective:  Patient seen and chart reviewed.  Patient is tolerating medications with out adverse effect; attending and participating in group sessions.  States that she took the Abilify this morning.   Principal Problem: MDD (major depressive disorder) Diagnosis:   Patient Active Problem List   Diagnosis Date Noted  . Alcohol abuse [F10.10] 05/11/2015  . Cocaine abuse [F14.10] 05/11/2015  . Generalized anxiety disorder [F41.1] 05/11/2015  . MDD (major depressive disorder) [F32.2] 05/10/2015  . Benzodiazepine overdose [T42.4X1A] 05/08/2015  . Suicidal overdose [T50.902A] 05/08/2015  . Hypokalemia [E87.6] 05/08/2015  . Overdose [T50.901A] 05/07/2015  . Diabetes [E11.9] 05/07/2015  . Anxiety [F41.9] 05/07/2015  . Hypertension [I10] 05/07/2015   Total Time spent with patient: 20 minutes   Past Medical History:  Past Medical History  Diagnosis Date  . Anxiety   . Diabetes mellitus without complication   . ETOH abuse   . Hypertension     Past Surgical History  Procedure Laterality Date  . Cholecystectomy     Family History: History reviewed. No pertinent family history. Social History:  History  Alcohol Use  . Yes    Comment: daily use     History  Drug Use  . Yes  . Special: Cocaine, Other-see comments    Comment: methodone    History   Social History  . Marital Status: Married    Spouse Name: N/A  . Number of Children: N/A  . Years of Education: N/A   Social History Main Topics  . Smoking status: Current Every Day Smoker -- 1.00 packs/day for 40 years    Types: Cigarettes  . Smokeless tobacco: Never Used  . Alcohol Use: Yes     Comment: daily use  . Drug Use: Yes    Special: Cocaine, Other-see comments     Comment: methodone  . Sexual Activity: Not Currently   Other Topics Concern  . None    Social History Narrative   Additional History:    Sleep: Good  Appetite:  Good   Assessment:   Musculoskeletal: Strength & Muscle Tone: within normal limits Gait & Station: normal Patient leans: N/A   Psychiatric Specialty Exam: Physical Exam  Vitals reviewed. Constitutional: She is oriented to person, place, and time.  Neck: Normal range of motion.  Respiratory: Effort normal.  Musculoskeletal: Normal range of motion.  Neurological: She is alert and oriented to person, place, and time.    Review of Systems  Psychiatric/Behavioral: Positive for depression and substance abuse. The patient is nervous/anxious and has insomnia.   All other systems reviewed and are negative.   Blood pressure 148/83, pulse 82, temperature 98.5 F (36.9 C), temperature source Oral, resp. rate 20, height 5\' 6"  (1.676 m), weight 78.472 kg (173 lb), SpO2 95 %.Body mass index is 27.94 kg/(m^2).   General Appearance: Fairly Groomed  Patent attorneyye Contact:: Fair  Speech: Clear and Coherent  Volume: Normal  Mood: Anxious and Depressed  Affect: Depressed and Tearful  Thought Process: Coherent and Goal Directed  Orientation: Full (Time, Place, and Person)  Thought Content: Denies hallucinations, delusions and paranoia  Suicidal Thoughts: No  Homicidal Thoughts: No  Memory: Immediate; Good Recent; Good Remote; Good  Judgement: Fair  Insight: Present  Psychomotor Activity: Normal  Concentration: Fair  Recall: Good  Fund of Knowledge:Fair  Language: Good  Akathisia: No  Handed: Right  AIMS (if indicated):    Assets: Desire for Improvement, Communication skill  Sleep: Number of Hours: 5.75  Cognition: WNL  ADL's: Intact        Current Medications: Current Facility-Administered Medications  Medication Dose Route Frequency Provider Last Rate Last Dose  . acetaminophen (TYLENOL) tablet 650 mg  650 mg Oral Q6H PRN Worthy Flank, NP   650 mg at  05/12/15 1108  . alum & mag hydroxide-simeth (MAALOX/MYLANTA) 200-200-20 MG/5ML suspension 30 mL  30 mL Oral Q4H PRN Worthy Flank, NP      . amLODipine (NORVASC) tablet 5 mg  5 mg Oral Q2000 Rachael Fee, MD   5 mg at 05/12/15 2001  . ARIPiprazole (ABILIFY) tablet 2 mg  2 mg Oral QHS Adonis Brook, NP   2 mg at 05/12/15 2113  . ciprofloxacin (CIPRO) tablet 250 mg  250 mg Oral BID Adonis Brook, NP   250 mg at 05/13/15 0837  . DULoxetine (CYMBALTA) DR capsule 90 mg  90 mg Oral Daily Adonis Brook, NP   90 mg at 05/13/15 1610  . folic acid (FOLVITE) tablet 1 mg  1 mg Oral Daily Worthy Flank, NP   1 mg at 05/13/15 9604  . gabapentin (NEURONTIN) capsule 800 mg  800 mg Oral TID Worthy Flank, NP   800 mg at 05/13/15 1313  . hydrOXYzine (ATARAX/VISTARIL) tablet 50 mg  50 mg Oral Q6H PRN Worthy Flank, NP   50 mg at 05/12/15 2113  . ibuprofen (ADVIL,MOTRIN) tablet 600 mg  600 mg Oral Q6H PRN Worthy Flank, NP   600 mg at 05/13/15 0843  . lisinopril (PRINIVIL,ZESTRIL) tablet 40 mg  40 mg Oral Daily Worthy Flank, NP   40 mg at 05/13/15 5409  . LORazepam (ATIVAN) tablet 1 mg  1 mg Oral Q6H PRN Worthy Flank, NP   1 mg at 05/12/15 1544  . magnesium hydroxide (MILK OF MAGNESIA) suspension 30 mL  30 mL Oral Daily PRN Worthy Flank, NP      . metFORMIN (GLUCOPHAGE) tablet 500 mg  500 mg Oral BID WC Worthy Flank, NP   500 mg at 05/13/15 0839  . nicotine (NICODERM CQ - dosed in mg/24 hours) patch 21 mg  21 mg Transdermal Daily Worthy Flank, NP   21 mg at 05/13/15 0839  . thiamine (VITAMIN B-1) tablet 100 mg  100 mg Oral Daily Worthy Flank, NP   100 mg at 05/13/15 0838  . traMADol (ULTRAM) tablet 50 mg  50 mg Oral Q6H PRN Rachael Fee, MD   50 mg at 05/12/15 2001  . traZODone (DESYREL) tablet 50 mg  50 mg Oral QHS PRN Worthy Flank, NP   50 mg at 05/12/15 2113    Lab Results:  Results for orders placed or performed during the hospital encounter of 05/10/15 (from the past 48  hour(s))  Glucose, capillary     Status: Abnormal   Collection Time: 05/11/15  5:10 PM  Result Value Ref Range   Glucose-Capillary 307 (H) 65 - 99 mg/dL  Glucose, capillary     Status: Abnormal   Collection Time: 05/11/15  9:34 PM  Result Value Ref Range   Glucose-Capillary 118 (H) 65 - 99 mg/dL  Glucose, capillary     Status: Abnormal   Collection Time: 05/12/15  6:04 AM  Result Value Ref Range   Glucose-Capillary 124 (H) 65 - 99  mg/dL  Glucose, capillary     Status: Abnormal   Collection Time: 05/12/15  4:59 PM  Result Value Ref Range   Glucose-Capillary 180 (H) 65 - 99 mg/dL  Glucose, capillary     Status: Abnormal   Collection Time: 05/12/15  7:10 PM  Result Value Ref Range   Glucose-Capillary 149 (H) 65 - 99 mg/dL  Glucose, capillary     Status: Abnormal   Collection Time: 05/12/15 10:14 PM  Result Value Ref Range   Glucose-Capillary 161 (H) 65 - 99 mg/dL   Comment 1 Notify RN    Comment 2 Document in Chart   Glucose, capillary     Status: Abnormal   Collection Time: 05/13/15  6:18 AM  Result Value Ref Range   Glucose-Capillary 131 (H) 65 - 99 mg/dL   Comment 1 Notify RN    Comment 2 Document in Chart     Physical Findings: AIMS: Facial and Oral Movements Muscles of Facial Expression: None, normal Lips and Perioral Area: None, normal Jaw: None, normal Tongue: None, normal,Extremity Movements Upper (arms, wrists, hands, fingers): None, normal Lower (legs, knees, ankles, toes): None, normal, Trunk Movements Neck, shoulders, hips: None, normal, Overall Severity Severity of abnormal movements (highest score from questions above): None, normal Incapacitation due to abnormal movements: None, normal Patient's awareness of abnormal movements (rate only patient's report): No Awareness, Dental Status Current problems with teeth and/or dentures?: No Does patient usually wear dentures?: No  CIWA:  CIWA-Ar Total: 3 COWS:  COWS Total Score: 9  Treatment Plan  Summary: Supportive approach/coping skills  Alcohol abuse/dependence; will monitor for withdrawal  Depression; Increased the Cymbalta to 90 mg (taking 60) and added Abilify 2 mg to augment. She cant take Wellbutrin due to side effects Will work on grief and loss ( still grieving the death of her husband)  Work a relapse prevention plan Use CBT/mindfulness Pain; will use Tramadol 50 mg Q 6 PRN.  Ibuprofen 600 mg Q6 H PRN   Continue current treatment plan  Medical Decision Making:  Review of Psycho-Social Stressors (1), Review or order clinical lab tests (1), Review of Last Therapy Session (1), Independent Review of image, tracing or specimen (2) and Review of Medication Regimen & Side Effects (2)  [place], FNP-BC 05/13/2015, 3:07 PM

## 2015-05-13 NOTE — Progress Notes (Addendum)
Patient ID: Robin Foster, female   DOB: 03/30/1956, 59 y.o.   MRN: [id number]   Pt currently presents with a flat affect and labile, irritable behavior. Pt frequently asks for medications, states "I'm really anxious." Per report from other staff, pt and her daughter have been arguing. Writer assessed pt for withdrawal symptoms, pt denies all, no obvious signs of physical symptoms.Pt provided with scheduled and prn medications per providers orders. Pt's CBG and vitals were monitored throughout the day. Pt supported emotionally and encouraged to express concerns and questions. Pt educated on medications. Pt's safety ensured with 15 minute and environmental checks. Pt currently denies SI/HI and A/V hallucinations. Pt verbally agrees to seek staff if SI/HI or A/VH occurs and to consult with staff before acting on these thoughts.

## 2015-05-13 NOTE — Progress Notes (Signed)
Patient did attend the evening speaker AA meeting.  

## 2015-05-14 DIAGNOSIS — F322 Major depressive disorder, single episode, severe without psychotic features: Secondary | ICD-10-CM | POA: Insufficient documentation

## 2015-05-14 LAB — GLUCOSE, CAPILLARY: GLUCOSE-CAPILLARY: 140 mg/dL — AB (ref 65–99)

## 2015-05-14 MED ORDER — ARIPIPRAZOLE 2 MG PO TABS
2.0000 mg | ORAL_TABLET | Freq: Every day | ORAL | Status: AC
Start: 2015-05-14 — End: ?

## 2015-05-14 MED ORDER — ADULT MULTIVITAMIN W/MINERALS CH: 1.0000 | ORAL_TABLET | Freq: Every day | ORAL | Status: AC

## 2015-05-14 MED ORDER — GABAPENTIN 400 MG PO CAPS: 800.0000 mg | ORAL_CAPSULE | Freq: Three times a day (TID) | ORAL | Status: AC

## 2015-05-14 MED ORDER — DULOXETINE HCL 30 MG PO CPEP: 90.0000 mg | ORAL_CAPSULE | Freq: Every day | ORAL | Status: AC

## 2015-05-14 MED ORDER — FOLIC ACID 1 MG PO TABS: 1.0000 mg | ORAL_TABLET | Freq: Every day | ORAL | Status: AC

## 2015-05-14 MED ORDER — METFORMIN HCL 500 MG PO TABS: 500.0000 mg | ORAL_TABLET | Freq: Two times a day (BID) | ORAL | Status: AC

## 2015-05-14 MED ORDER — LISINOPRIL 40 MG PO TABS
40.0000 mg | ORAL_TABLET | Freq: Every day | ORAL | Status: AC
Start: 2015-05-14 — End: ?

## 2015-05-14 MED ORDER — THIAMINE HCL 100 MG PO TABS: 100.0000 mg | ORAL_TABLET | Freq: Every day | ORAL | Status: AC

## 2015-05-14 MED ORDER — TRAZODONE HCL 50 MG PO TABS: 50.0000 mg | ORAL_TABLET | Freq: Every evening | ORAL | Status: AC | PRN

## 2015-05-14 MED ORDER — NICOTINE 21 MG/24HR TD PT24: 21.0000 mg | MEDICATED_PATCH | Freq: Every day | TRANSDERMAL | Status: AC

## 2015-05-14 MED ORDER — AMLODIPINE BESYLATE 5 MG PO TABS: 5.0000 mg | ORAL_TABLET | Freq: Every day | ORAL | Status: AC

## 2015-05-14 NOTE — Progress Notes (Signed)
Recreation Therapy Notes  Date: 07.11.16 Time: 9:30 am Location: [address] Group Room  Group Topic: Stress Management  Goal Area(s) Addresses:  Patient will verbalize importance of using healthy stress management.  Patient will identify positive emotions associated with healthy stress management.   Intervention: Stress Management  Activity :  Progressive Muscle Relaxation.  LRT introduced and educated patients on stress management technique of progressive muscle relaxation.  A script was used to deliver both techniques to patients.  Patients were asked to follow the script read a loud by the LRT to engage in practicing the technique.  Education:  Stress Management, Discharge Planning.   Education Outcome: Acknowledges edcuation/In group clarification offered/Needs additional education  Clinical Observations/Feedback: Patient did not attend group.    Caroll RancherMarjette Lavi Sheehan, LRT/CTRS         Caroll RancherLindsay, Harlow Carrizales A 05/14/2015 3:38 PM

## 2015-05-14 NOTE — [facility] Suicide Risk Assessment (Signed)
[facility] INPATIENT:  Family/Significant Other Suicide Prevention Education  Suicide Prevention Education:  Education Completed; Daughter Robin BradleyJessica Foster [phone],  (name of family member/significant other) has been identified by the patient as the family member/significant other with whom the patient will be residing, and identified as the person(s) who will aid the patient in the event of a mental health crisis (suicidal ideations/suicide attempt).  With written consent from the patient, the family member/significant other has been provided the following suicide prevention education, prior to the and/or following the discharge of the patient.  The suicide prevention education provided includes the following:  Suicide risk factors  Suicide prevention and interventions  National Suicide Hotline telephone number  [facility] assessment telephone number  [facility] Emergency Assistance 911  [facility] and/or Residential Mobile Crisis Unit telephone number  Request made of family/significant other to:  Remove weapons (e.g., guns, rifles, knives), all items previously/currently identified as safety concern.    Remove drugs/medications (over-the-counter, prescriptions, illicit drugs), all items previously/currently identified as a safety concern.  The family member/significant other verbalizes understanding of the suicide prevention education information provided.  The family member/significant other agrees to remove the items of safety concern listed above.  Robin Foster, [place] CarboKristin Foster 05/14/2015, 3:59 PM

## 2015-05-14 NOTE — [facility] Suicide Risk Assessment (Signed)
[facility]   Demographic Factors:  Caucasian  Total Time spent with patient: 30 minutes  Musculoskeletal: Strength & Muscle Tone: within normal limits Gait & Station: normal Patient leans: normal  Psychiatric Specialty Exam: Physical Exam  Review of Systems  Constitutional: Negative.   HENT: Negative.   Eyes: Negative.   Respiratory: Negative.   Cardiovascular: Negative.   Gastrointestinal: Negative.   Genitourinary: Negative.   Musculoskeletal: Negative.   Skin: Negative.   Neurological: Negative.   Endo/Heme/Allergies: Negative.   Psychiatric/Behavioral: Positive for depression and substance abuse. The patient is nervous/anxious.     Blood pressure 147/82, pulse 79, temperature 98.5 F (36.9 C), temperature source Oral, resp. rate 16, height 5\' 6"  (1.676 m), weight 78.472 kg (173 lb), SpO2 95 %.Body mass index is 27.94 kg/(m^2).  General Appearance: Fairly Groomed  Patent attorneyye Contact::  Fair  Speech:  Clear and Coherent409  Volume:  Normal  Mood:  Euthymic  Affect:  Appropriate  Thought Process:  Coherent and Goal Directed  Orientation:  Full (Time, Place, and Person)  Thought Content:  plans as she moves on, relapse prevention plan  Suicidal Thoughts:  No  Homicidal Thoughts:  No  Memory:  Immediate;   Fair Recent;   Fair Remote;   Fair  Judgement:  Fair  Insight:  Present  Psychomotor Activity:  Normal  Concentration:  Fair  Recall:  FiservFair  Fund of Knowledge:Fair  Language: Fair  Akathisia:  No  Handed:  Right  AIMS (if indicated):     Assets:  Desire for Improvement  Sleep:  Number of Hours: 5.75  Cognition: WNL  ADL's:  Intact   Have you used any form of tobacco in the last 30 days? (Cigarettes, Smokeless Tobacco, Cigars, and/or Pipes): Yes  Has this patient used any form of tobacco in the last 30 days? (Cigarettes, Smokeless Tobacco, Cigars, and/or Pipes) Yes, Prescription not provided because: nicotine patches given  Mental  Status Per Nursing Assessment::   On Admission:  Suicidal ideation indicated by others  Current Mental Status by Physician: In full contact with reality. There are no active S/S of withdrawal. There are no active SI plans or intent. She feels ready to go home. [place] She is meanwhile to follow up there. States she is better and is committed to abstinence. Her depression and anxiety are better   Loss Factors: Loss of significant relationship  Historical Factors: NA  Risk Reduction Factors:   Sense of responsibility to family, Living with another person, especially a relative and Positive social support  Continued Clinical Symptoms:  Alcohol/Substance Abuse/Dependencies  Cognitive Features That Contribute To Risk:  None    Suicide Risk:  Minimal: No identifiable suicidal ideation.  Patients presenting with no risk factors but with morbid ruminations; may be classified as minimal risk based on the severity of the depressive symptoms  Principal Problem: MDD (major depressive disorder) Discharge Diagnoses:  Patient Active Problem List   Diagnosis Date Noted  . Alcohol abuse [F10.10] 05/11/2015  . Cocaine abuse [F14.10] 05/11/2015  . Generalized anxiety disorder [F41.1] 05/11/2015  . MDD (major depressive disorder) [F32.2] 05/10/2015  . Benzodiazepine overdose [T42.4X1A] 05/08/2015  . Suicidal overdose [T50.902A] 05/08/2015  . Hypokalemia [E87.6] 05/08/2015  . Overdose [T50.901A] 05/07/2015  . Diabetes [E11.9] 05/07/2015  . Anxiety [F41.9] 05/07/2015  . Hypertension [I10] 05/07/2015    Follow-up Information    [facility]   Why:  Please  contact at discharge to schedule follow up appointment for therapy and medication management services.   Contact information:   [address] [address] [phone]      Plan Of Care/Follow-up recommendations:  Activity:  as tolerated Diet:   regular Follow up outpatient as above Is patient on multiple antipsychotic therapies at discharge:  No   Has Patient had three or more failed trials of antipsychotic monotherapy by history:  No  Recommended Plan for Multiple Antipsychotic Therapies: NA    Colleena Kurtenbach A 05/14/2015, 3:15 PM

## 2015-05-14 NOTE — Progress Notes (Signed)
DISCHARGE NOTE: D: Patient was alert, oriented, and ambulatory with a steady gait upon discharge. Pt denies SI/HI and AVH. A: AVS reviewed and given to pt. Follow up reviewed with pt. Resources reviewed with pt, including [facility]. Prescriptions/Medications given to pt. Belongings returned to pt. Pt given time to ask questions and express concerns. Pt encouraged to follow up with PCP for any additional medical concerns including HTN and Diabetes. R: Pt D/C'd to "grand daughter."

## 2015-05-14 NOTE — Discharge Summary (Signed)
Physician Discharge Summary Note  Patient:  Robin Foster is an 59 y.o., female MRN:  [id number] DOB:  02/16/1956 Patient phone:  [phone] (home)  Patient address:   [address] [place] [id number],  Total Time spent with patient: 30 minutes  Date of Admission:  05/10/2015 Date of Discharge: 05/14/15  Reason for Admission:  Depression with suicide attempt   Principal Problem: MDD (major depressive disorder) Discharge Diagnoses: Patient Active Problem List   Diagnosis Date Noted  . Major depressive disorder, single episode, severe without psychotic features [F32.2]   . Alcohol abuse [F10.10] 05/11/2015  . Cocaine abuse [F14.10] 05/11/2015  . Generalized anxiety disorder [F41.1] 05/11/2015  . MDD (major depressive disorder) [F32.2] 05/10/2015  . Benzodiazepine overdose [T42.4X1A] 05/08/2015  . Suicidal overdose [T50.902A] 05/08/2015  . Hypokalemia [E87.6] 05/08/2015  . Overdose [T50.901A] 05/07/2015  . Diabetes [E11.9] 05/07/2015  . Anxiety [F41.9] 05/07/2015  . Hypertension [I10] 05/07/2015    Musculoskeletal: Strength & Muscle Tone: within normal limits Gait & Station: normal Patient leans: N/A  Psychiatric Specialty Exam: Physical Exam  Psychiatric: She has a normal mood and affect. Her speech is normal and behavior is normal. Judgment and thought content normal. Cognition and memory are normal.    Review of Systems  Constitutional: Negative.   HENT: Negative.   Eyes: Negative.   Respiratory: Negative.   Cardiovascular: Negative.   Gastrointestinal: Negative.   Genitourinary: Negative.   Musculoskeletal: Negative.   Skin: Negative.   Neurological: Negative.   Endo/Heme/Allergies: Negative.   Psychiatric/Behavioral: Positive for depression (Stabilized with treatments ) and substance abuse (Positive for cocaine on admission. ). Negative for suicidal ideas, hallucinations and memory loss. The patient is not nervous/anxious and does not have insomnia.      Blood pressure 147/82, pulse 79, temperature 98.5 F (36.9 C), temperature source Oral, resp. rate 16, height 5\' 6"  (1.676 m), weight 78.472 kg (173 lb), SpO2 95 %.Body mass index is 27.94 kg/(m^2).  See Physician SRA     Have you used any form of tobacco in the last 30 days? (Cigarettes, Smokeless Tobacco, Cigars, and/or Pipes): Yes  Has this patient used any form of tobacco in the last 30 days? (Cigarettes, Smokeless Tobacco, Cigars, and/or Pipes) Yes, a prescription was given for Nicotine patches along with a supply   Past Medical History:  Past Medical History  Diagnosis Date  . Anxiety   . Diabetes mellitus without complication   . ETOH abuse   . Hypertension     Past Surgical History  Procedure Laterality Date  . Cholecystectomy     Family History: History reviewed. No pertinent family history. Social History:  History  Alcohol Use  . Yes    Comment: daily use     History  Drug Use  . Yes  . Special: Cocaine, Other-see comments    Comment: methodone    History   Social History  . Marital Status: Married    Spouse Name: N/A  . Number of Children: N/A  . Years of Education: N/A   Social History Main Topics  . Smoking status: Current Every Day Smoker -- 1.00 packs/day for 40 years    Types: Cigarettes  . Smokeless tobacco: Never Used  . Alcohol Use: Yes     Comment: daily use  . Drug Use: Yes    Special: Cocaine, Other-see comments     Comment: methodone  . Sexual Activity: Not Currently   Other Topics Concern  . None  Social History Narrative    Risk to Self: Is patient at risk for suicide?: Yes What has been your use of drugs/alcohol within the last 12 months?: Beer 6 16 oz daily or more, 3-4 prescribed Xana daily and smoking crack cocaine "occassionally" Risk to Others:   Prior Inpatient Therapy:   Prior Outpatient Therapy:    Level of Care:  OP  Hospital Course:    Robin Foster is a 60 y.o. female patient admitted with Benzo OD.  Initial reports note that it was from Xanax OD. However, patient reported that it was from Valium. "I didn't want to die. I just wanted to get some sleep." Patient has been suffering from insomnia through the years. She has two grown children and she was most close to the one that lived in [place]. She also stated that she has been having vivid dreams lately, not violent, but of old friends and classmates and going to the spa. She also finds sadness in knowing and realizing that a lot of her friends she know are dying and she feels lonely from it.         Robin Foster was admitted to the adult 300 unit where she was evaluated and her symptoms were identified. Medication management was discussed and implemented. Her Cymbalta was increased to 90 mg daily to help with depression and chronic pain. The medication Abilify 2 mg daily was added to augment her response to the antidepressant. The patient was not prescribed any benzodiazepines due to her previous suicide attempt and long history of dependence. She was encouraged to participate in unit programming. Medical problems were identified and treated appropriately. Her blood glucose levels were monitored and Metformin was administered for Type 2 Diabetes.  Patient received Norvasc 5 mg daily and Lisinopril 40 mg daily for treatment of Hypertension. She received a course of Cipro for treatment of UTI. Patient received prn ultram for complaints for rib pain from a fall that occurred prior to admission.  Home medication was restarted as needed.   She was evaluated each day by a clinical provider to ascertain the patient's response to treatment.  Improvement was noted by the patient's report of decreasing symptoms, improved sleep and appetite, affect, medication tolerance, behavior, and participation in unit programming.  The patient was asked each day to complete a self inventory noting mood, mental status, pain, new symptoms, anxiety and concerns.          She responded well to medication and being in a therapeutic and supportive environment. Positive and appropriate behavior was noted and the patient was motivated for recovery.  She worked closely with the treatment team and case manager to develop a discharge plan with appropriate goals. Coping skills, problem solving as well as relaxation therapies were also part of the unit programming. Patient planned on staying away from illicit substances such as cocaine.          By the day of discharge she was in much improved condition than upon admission.  Symptoms were reported as significantly decreased or resolved completely. The patient denied SI/HI and voiced no AVH. She was motivated to continue taking medication with a goal of continued improvement in mental health.  Robin Foster was discharged home with a plan to follow up as noted below. The patient was provided with sample medications and prescriptions at time of discharge. She left [facility] in stable condition with all belongings returned to her.   Consults:  psychiatry  Significant Diagnostic Studies:  Chemistry panel, A1c, glucose monitoring, UDS positive for cocaine   Discharge Vitals:   Blood pressure 147/82, pulse 79, temperature 98.5 F (36.9 C), temperature source Oral, resp. rate 16, height  (1.676 m), weight 78.472 kg (173 lb), SpO2 95 %. Body mass index is 27.94 kg/(m^2). Lab Results:   Results for orders placed or performed during the hospital encounter of 05/10/15 (from the past 72 hour(s))  Glucose, capillary     Status: Abnormal   Collection Time: 05/11/15  5:10 PM  Result Value Ref Range   Glucose-Capillary 307 (H) 65 - 99 mg/dL  Glucose, capillary     Status: Abnormal   Collection Time: 05/11/15  9:34 PM  Result Value Ref Range   Glucose-Capillary 118 (H) 65 - 99 mg/dL  Glucose, capillary     Status: Abnormal   Collection Time: 05/12/15  6:04 AM  Result Value Ref Range   Glucose-Capillary 124 (H) 65 - 99 mg/dL   Glucose, capillary     Status: Abnormal   Collection Time: 05/12/15  4:59 PM  Result Value Ref Range   Glucose-Capillary 180 (H) 65 - 99 mg/dL  Glucose, capillary     Status: Abnormal   Collection Time: 05/12/15  7:10 PM  Result Value Ref Range   Glucose-Capillary 149 (H) 65 - 99 mg/dL  Glucose, capillary     Status: Abnormal   Collection Time: 05/12/15 10:14 PM  Result Value Ref Range   Glucose-Capillary 161 (H) 65 - 99 mg/dL   Comment 1 Notify RN    Comment 2 Document in Chart   Glucose, capillary     Status: Abnormal   Collection Time: 05/13/15  6:18 AM  Result Value Ref Range   Glucose-Capillary 131 (H) 65 - 99 mg/dL   Comment 1 Notify RN    Comment 2 Document in Chart   Glucose, capillary     Status: Abnormal   Collection Time: 05/13/15  4:48 PM  Result Value Ref Range   Glucose-Capillary 123 (H) 65 - 99 mg/dL   Comment 1 Notify RN    Comment 2 Document in Chart   Glucose, capillary     Status: Abnormal   Collection Time: 05/14/15  6:06 AM  Result Value Ref Range   Glucose-Capillary 140 (H) 65 - 99 mg/dL   Comment 1 Notify RN    Comment 2 Document in Chart     Physical Findings: AIMS: Facial and Oral Movements Muscles of Facial Expression: None, normal Lips and Perioral Area: None, normal Jaw: None, normal Tongue: None, normal,Extremity Movements Upper (arms, wrists, hands, fingers): None, normal Lower (legs, knees, ankles, toes): None, normal, Trunk Movements Neck, shoulders, hips: None, normal, Overall Severity Severity of abnormal movements (highest score from questions above): None, normal Incapacitation due to abnormal movements: None, normal Patient's awareness of abnormal movements (rate only patient's report): No Awareness, Dental Status Current problems with teeth and/or dentures?: No Does patient usually wear dentures?: No  CIWA:  CIWA-Ar Total: 3 COWS:  COWS Total Score: 9   See Psychiatric Specialty Exam and Suicide Risk Assessment completed by  Attending Physician prior to discharge.  Discharge destination:  Home  Is patient on multiple antipsychotic therapies at discharge:  No   Has Patient had three or more failed trials of antipsychotic monotherapy by history:  No  Recommended Plan for Multiple Antipsychotic Therapies: NA      Discharge Instructions    Discharge instructions    Complete by:  As directed   Please  follow up with your Primary Care Provider for further management of medical problems such as Hypertension as scheduled and for refills of these medications.            Medication List    TAKE these medications      Indication   amLODipine 5 MG tablet  Commonly known as:  NORVASC  Take 1 tablet (5 mg total) by mouth daily.   Indication:  High Blood Pressure     ARIPiprazole 2 MG tablet  Commonly known as:  ABILIFY  Take 1 tablet (2 mg total) by mouth at bedtime.   Indication:  Major Depressive Disorder, mood stabilization     DULoxetine 30 MG capsule  Commonly known as:  CYMBALTA  Take 3 capsules (90 mg total) by mouth daily.   Indication:  Major Depressive Disorder     folic acid 1 MG tablet  Commonly known as:  FOLVITE  Take 1 tablet (1 mg total) by mouth daily.   Indication:  Deficiency of Folic Acid in the Diet     gabapentin 400 MG capsule  Commonly known as:  NEURONTIN  Take 2 capsules (800 mg total) by mouth 3 (three) times daily.   Indication:  Neuropathic Pain, Mood control     lisinopril 40 MG tablet  Commonly known as:  PRINIVIL,ZESTRIL  Take 1 tablet (40 mg total) by mouth daily.   Indication:  High Blood Pressure     metFORMIN 500 MG tablet  Commonly known as:  GLUCOPHAGE  Take 1 tablet (500 mg total) by mouth 2 (two) times daily with a meal.   Indication:  Type 2 Diabetes     multivitamin with minerals Tabs tablet  Take 1 tablet by mouth daily.   Indication:  Vitamin Supplementation     nicotine 21 mg/24hr patch  Commonly known as:  NICODERM CQ - dosed in mg/24 hours   Place 1 patch (21 mg total) onto the skin daily.   Indication:  Nicotine Addiction     thiamine 100 MG tablet  Take 1 tablet (100 mg total) by mouth daily.   Indication:  Deficiency in Thiamine or Vitamin B1     traMADol 50 MG tablet  Commonly known as:  ULTRAM  Take 1 tablet (50 mg total) by mouth every 6 (six) hours as needed for moderate pain or severe pain.      traZODone 50 MG tablet  Commonly known as:  DESYREL  Take 1 tablet (50 mg total) by mouth at bedtime as needed for sleep (may repeat).   Indication:  Trouble Sleeping       Follow-up Information    Follow up with [facility] On 05/18/2015.   Why:  Appointment on Friday July 15th at 11:45 am with Vassie Loll to get established with therapy and medication management services.   Contact information:   [address] [address] [phone]      Follow-up recommendations:   Activity: as tolerated Diet: regular Follow up outpatient as above  Comments:   Take all your medications as prescribed by your mental healthcare provider.  Report any adverse effects and or reactions from your medicines to your outpatient provider promptly.  Patient is instructed and cautioned to not engage in alcohol and or illegal drug use while on prescription medicines.  In the event of worsening symptoms, patient is instructed to call the crisis hotline, 911 and or go to the nearest ED for appropriate evaluation and treatment of symptoms.  Follow-up with your primary care provider for your other medical issues, concerns and or health care needs.   Total Discharge Time: Greater than 30 minutes  Signed: Fransisca Kaufmann, NP-C 05/14/2015, 4:47 PM I personally assessed the patient and formulated the plan Madie Reno A. McConnells, [place].D.I personally assessed the patient and formulated the plan Madie Reno A. Dub Mikes, M.D.

## 2015-05-14 NOTE — Progress Notes (Signed)
  [facility] :  Will you be returning to the same living situation after discharge:  Yes,  Patient plans to return home At discharge, do you have transportation home?: Yes,  patient reports access to transportation by family Do you have the ability to pay for your medications: Yes,  Patient will be provided with medication samples and prescriptions  Release of information consent forms completed and in the chart;  Patient's signature needed at discharge.  Patient to Follow up at: Follow-up Information    [facility] On 05/18/2015.   Why:  Appointment on Friday July 15th at 11:45 am with Vassie LollAngela Breedlove to get established with therapy and medication management services.   Contact information:   [address] [place] [id number] [phone]      Patient denies SI/HI: Yes,  denies    Safety Planning and Suicide Prevention discussed: Yes,  with patient and daughter  Have you used any form of tobacco in the last 30 days? (Cigarettes, Smokeless Tobacco, Cigars, and/or Pipes): Yes  Has patient been referred to the Quitline?: Patient refused referral  Robin Foster, Robin Foster 05/14/2015, 3:59 PM

## 2015-05-14 NOTE — Plan of Care (Signed)
Problem: Alteration in mood & ability to function due to Goal: LTG-Pt reports reduction in suicidal thoughts (Patient reports reduction in suicidal thoughts and is able to verbalize a safety plan for whenever patient is feeling suicidal)  Outcome: Progressing Pt denies suicidal ideation

## 2015-05-14 NOTE — Progress Notes (Signed)
D: Patient is alert and oriented. Pt's mood and affect is pleasant and blunted. Pt denies SI/HI and AVH. Pt rates depression 4/10, hopelessness 0/10, and anxiety 3/10. Pt reports her goal today is "self, concentrate, and pray." Pt experiencing HTN today, denies symptoms (See Docflowsheet-vitals). Pt C/O right rib cage pain 7/10, pt reports no relief from PRN medication, appears to be tolerating pain level well. Pt C/O anxiety this morning which decreased with PRN medication. Pt is attending unit groups today. A: Active listening by RN. Encouragement/Support provided to pt. Will reassess and monitor BP. Pt encouraged to rest/relax when PRN medication no effective for pain relief. Medication education reviewed with pt. PRN medication administered for pain and anxiety per providers orders (See MAR). Scheduled medications administered per providers orders (See MAR). 15 minute checks continued per protocol for patient safety.  R: Patient cooperative and receptive to nursing interventions. Pt remains safe.

## 2015-05-14 NOTE — Plan of Care (Signed)
Problem: Alteration in mood & ability to function due to Goal: STG-Patient will attend groups Outcome: Progressing Pt is attending group with appropriate participation.

## 2015-05-14 NOTE — Progress Notes (Signed)
D.  Pt pleasant on approach, denies complaints other than continued anxiety and rib pain.  Positive for evening AA group, interacting appropriately with peers on the unit.  Denies SI/HI/hallucinatons at this time.  A.  Support and encouragement offered  R.  Pt remains safe on unit, will continue to monitor.

## 2015-05-14 NOTE — Clinical Social Work Note (Signed)
CSW left voicemail for [facility] regarding scheduling aftercare appointment. Awaiting return call.  Samuella BruinKristin Donoven Pett, MSW, [employer]CSWA Clinical Social Worker [facility] [phone]

## 2015-05-14 NOTE — [facility] Group Notes (Signed)
[facility] LCSW Aftercare Discharge Planning Group Note  05/14/2015  8:45 AM  Participation Quality: Did Not Attend. Patient invited to participate but declined.   Kairee Kozma, MSW, LCSWA Clinical Social Worker [facility] [phone]  

## 2015-11-06 IMAGING — CR DG RIBS W/ CHEST 3+V*R*
4 series · 4 of 4 positions shown · non-contrast
Comparison: None.

CLINICAL DATA: Recent overdose.  Fall.  Initial evaluation .

EXAM:
RIGHT RIBS AND CHEST - 3+ VIEW

[view not recorded (1 of 4)]
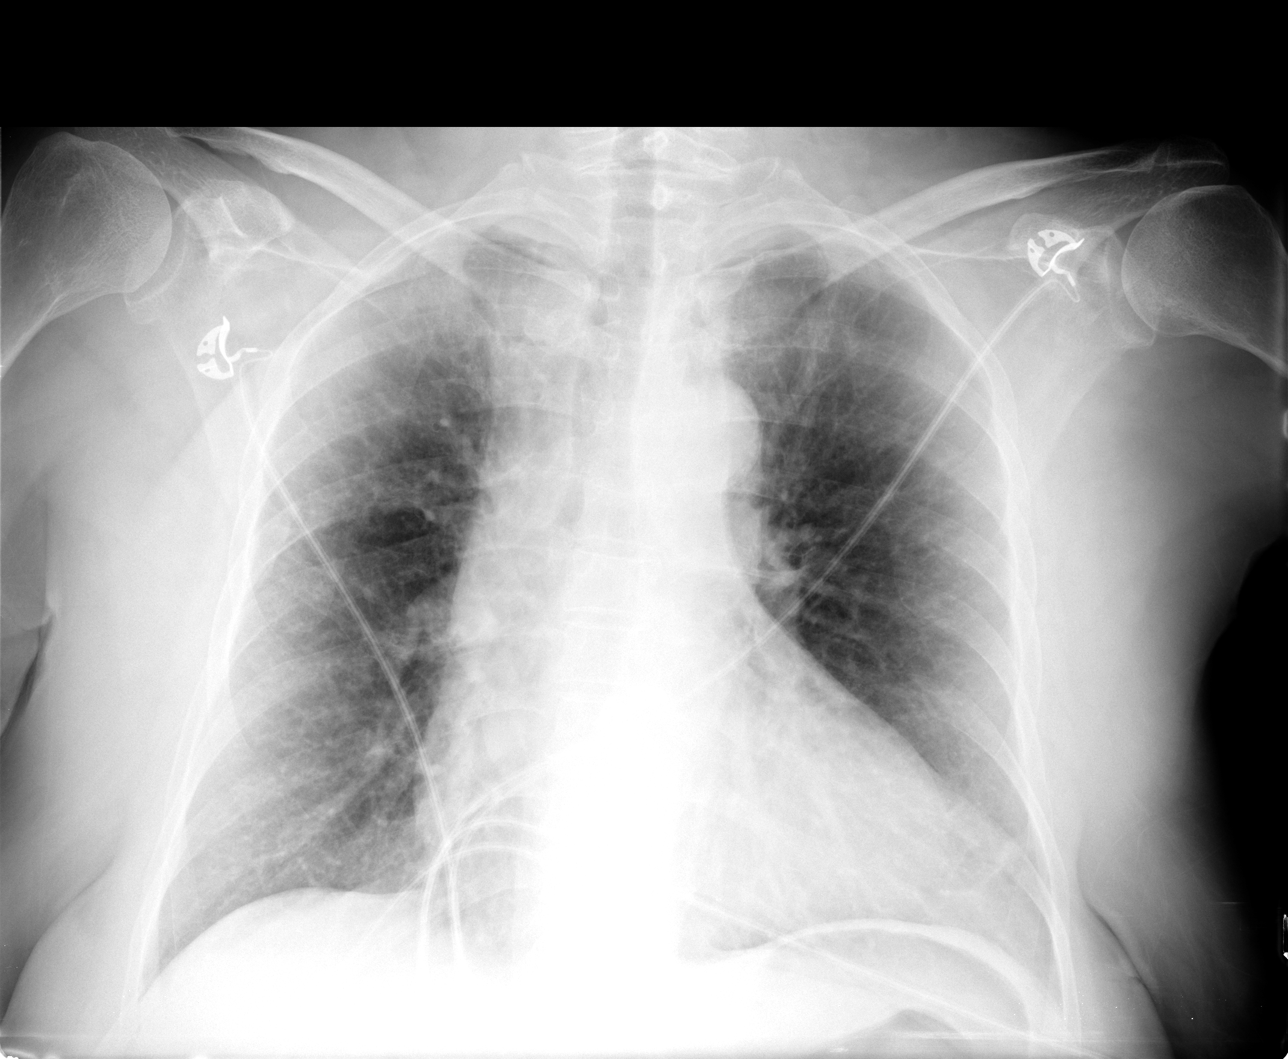

[view not recorded (2 of 4)]
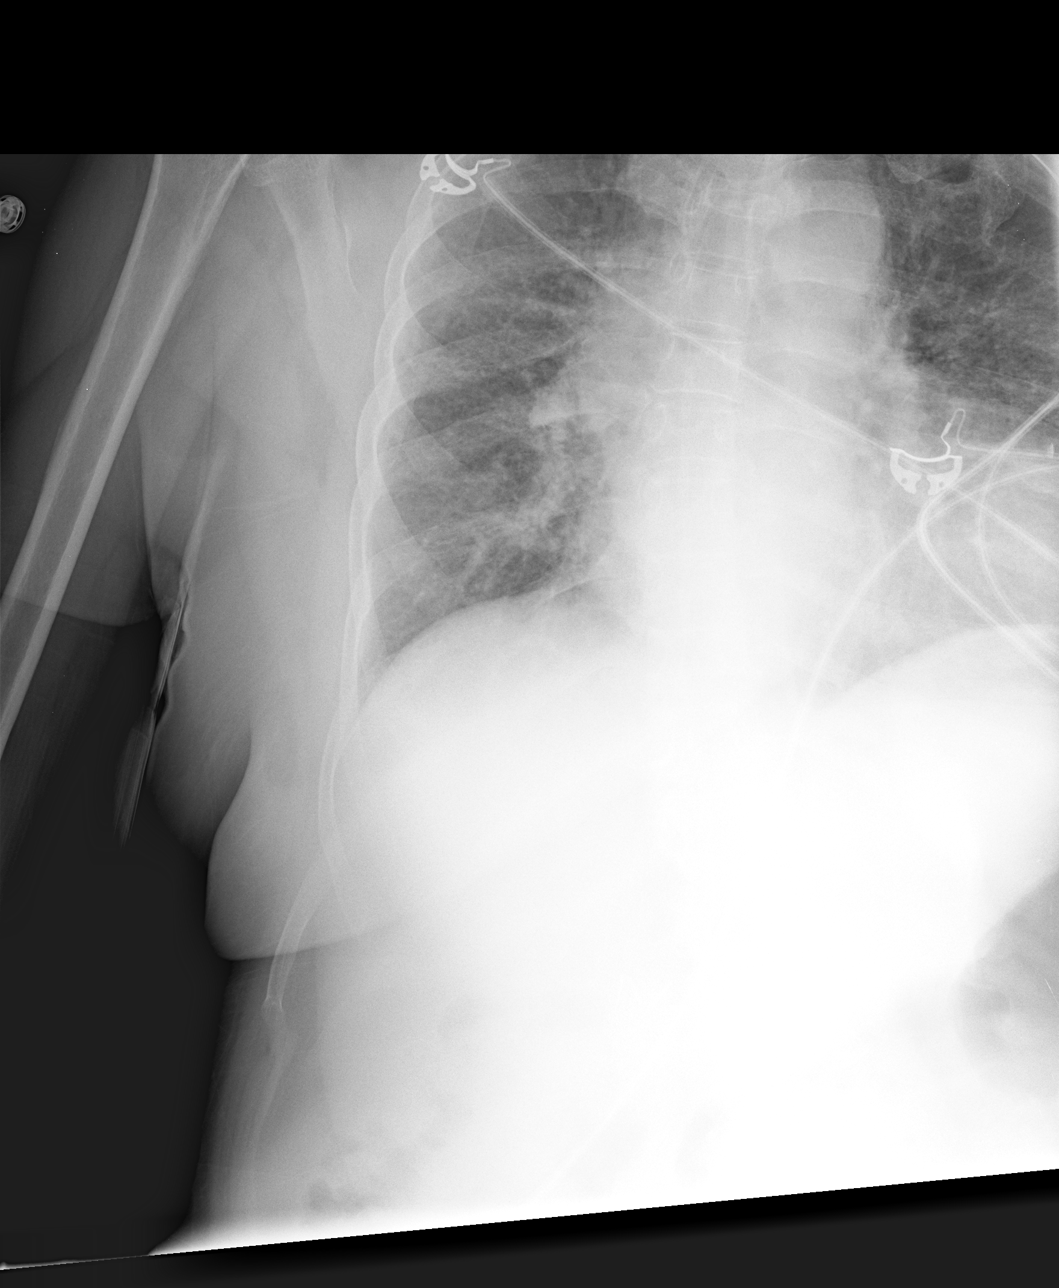

[view not recorded (3 of 4)]
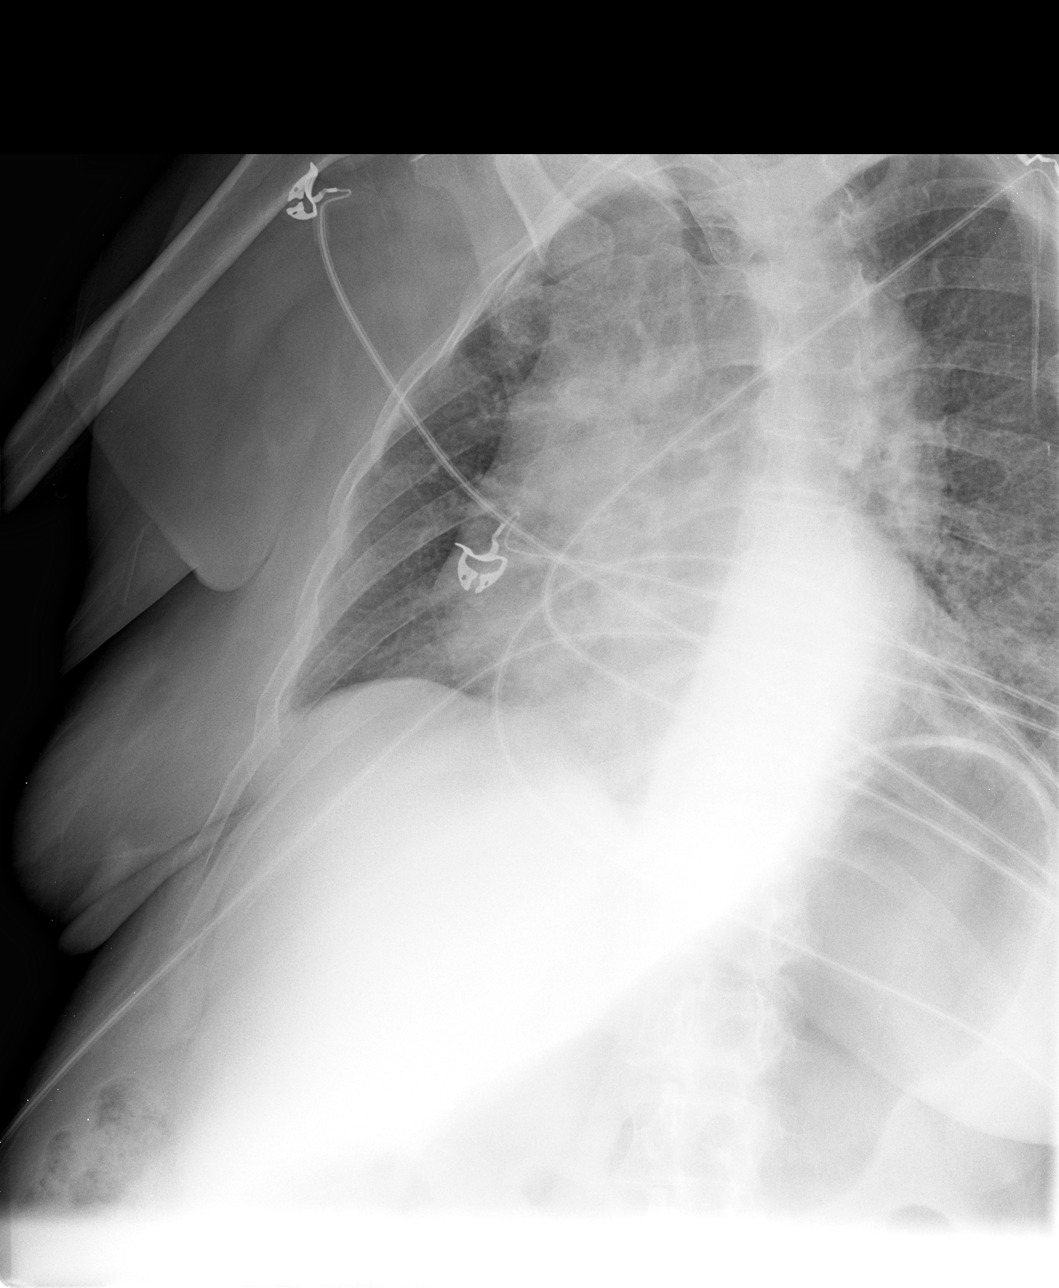

[view not recorded (4 of 4)]
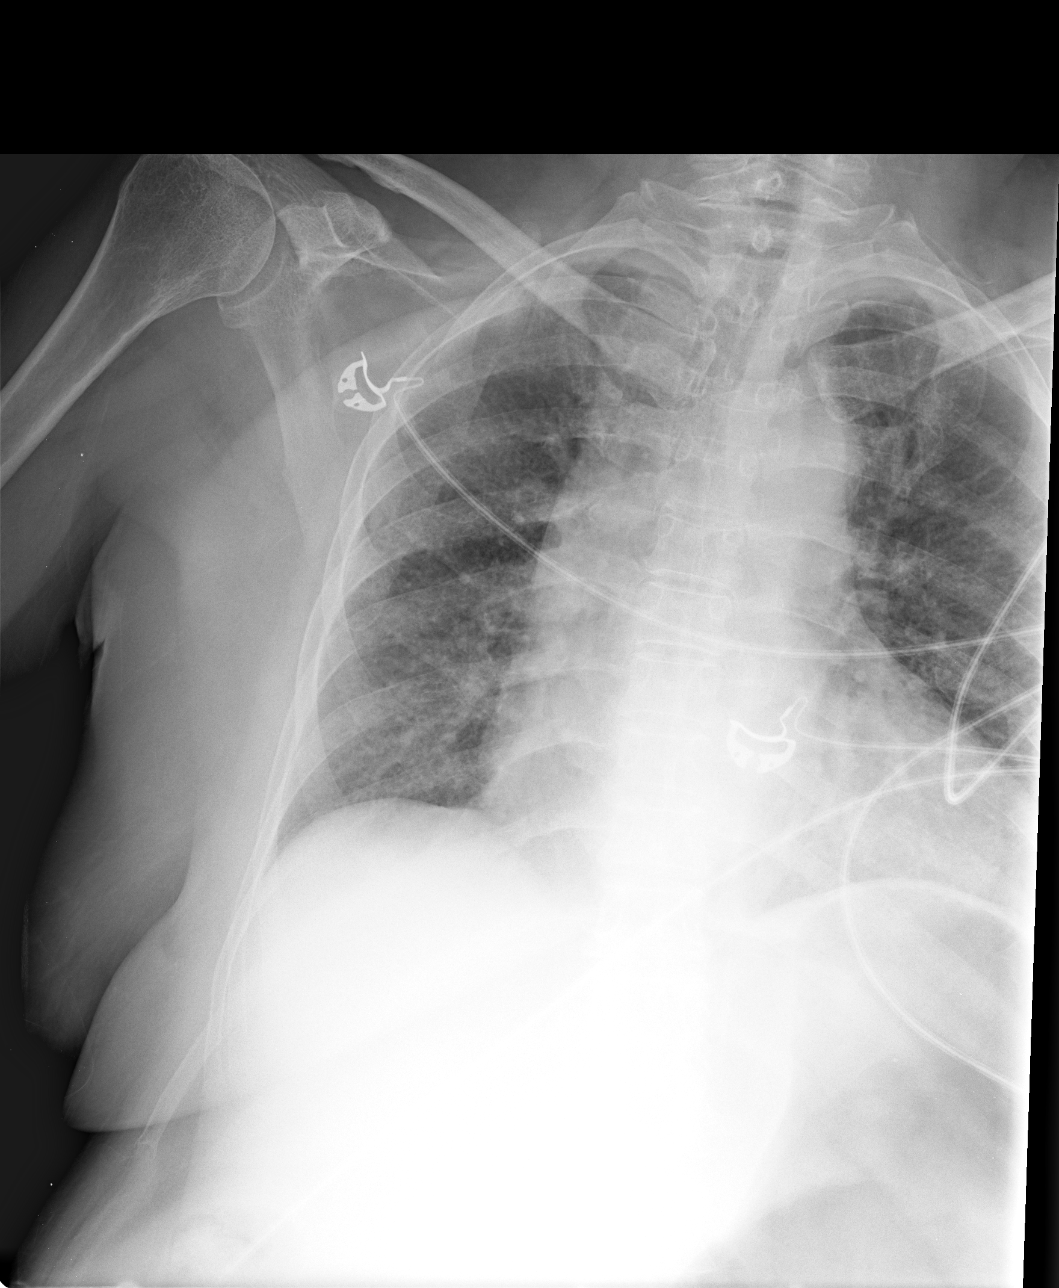

[4 of 4 positions shown; findings below may reference images not displayed]

FINDINGS: Subtle fracture of the right second rib cannot be excluded. No
displaced fracture noted . No pneumothorax. Cardiomegaly.
IMPRESSION: Subtle fracture of right second rib cannot be excluded. No displaced
fracture noted.

## 2019-12-05 DEATH — deceased
